# Patient Record
Sex: Female | Born: 1975
Health system: Southern US, Community
[De-identification: ages and names within clinical notes are randomized; demographics above are authoritative.]

## PROBLEM LIST (undated history)

## (undated) DIAGNOSIS — M3500 Sicca syndrome, unspecified: Secondary | ICD-10-CM

## (undated) DIAGNOSIS — F411 Generalized anxiety disorder: Secondary | ICD-10-CM

## (undated) DIAGNOSIS — K219 Gastro-esophageal reflux disease without esophagitis: Secondary | ICD-10-CM

## (undated) DIAGNOSIS — I959 Hypotension, unspecified: Secondary | ICD-10-CM

## (undated) DIAGNOSIS — M6281 Muscle weakness (generalized): Secondary | ICD-10-CM

## (undated) DIAGNOSIS — L259 Unspecified contact dermatitis, unspecified cause: Secondary | ICD-10-CM

## (undated) DIAGNOSIS — Z800 Family history of malignant neoplasm of digestive organs: Secondary | ICD-10-CM

## (undated) DIAGNOSIS — F329 Major depressive disorder, single episode, unspecified: Secondary | ICD-10-CM

## (undated) DIAGNOSIS — M6282 Rhabdomyolysis: Secondary | ICD-10-CM

## (undated) DIAGNOSIS — M2550 Pain in unspecified joint: Secondary | ICD-10-CM

## (undated) DIAGNOSIS — N3010 Interstitial cystitis (chronic) without hematuria: Secondary | ICD-10-CM

## (undated) DIAGNOSIS — IMO0001 Reserved for inherently not codable concepts without codable children: Secondary | ICD-10-CM

## (undated) DIAGNOSIS — N926 Irregular menstruation, unspecified: Secondary | ICD-10-CM

## (undated) DIAGNOSIS — J45909 Unspecified asthma, uncomplicated: Secondary | ICD-10-CM

## (undated) DIAGNOSIS — K759 Inflammatory liver disease, unspecified: Secondary | ICD-10-CM

## (undated) DIAGNOSIS — D126 Benign neoplasm of colon, unspecified: Secondary | ICD-10-CM

## (undated) DIAGNOSIS — G2581 Restless legs syndrome: Secondary | ICD-10-CM

## (undated) DIAGNOSIS — D473 Essential (hemorrhagic) thrombocythemia: Secondary | ICD-10-CM

## (undated) DIAGNOSIS — E079 Disorder of thyroid, unspecified: Secondary | ICD-10-CM

## (undated) DIAGNOSIS — K760 Fatty (change of) liver, not elsewhere classified: Secondary | ICD-10-CM

## (undated) DIAGNOSIS — R339 Retention of urine, unspecified: Secondary | ICD-10-CM

## (undated) DIAGNOSIS — R7982 Elevated C-reactive protein (CRP): Secondary | ICD-10-CM

## (undated) DIAGNOSIS — E663 Overweight: Secondary | ICD-10-CM

## (undated) DIAGNOSIS — Z8679 Personal history of other diseases of the circulatory system: Secondary | ICD-10-CM

## (undated) DIAGNOSIS — E282 Polycystic ovarian syndrome: Secondary | ICD-10-CM

## (undated) DIAGNOSIS — I10 Essential (primary) hypertension: Secondary | ICD-10-CM

## (undated) DIAGNOSIS — G43909 Migraine, unspecified, not intractable, without status migrainosus: Secondary | ICD-10-CM

## (undated) DIAGNOSIS — K589 Irritable bowel syndrome without diarrhea: Secondary | ICD-10-CM

## (undated) DIAGNOSIS — E785 Hyperlipidemia, unspecified: Secondary | ICD-10-CM

## (undated) DIAGNOSIS — F341 Dysthymic disorder: Secondary | ICD-10-CM

## (undated) DIAGNOSIS — Q613 Polycystic kidney, unspecified: Secondary | ICD-10-CM

## (undated) DIAGNOSIS — F3289 Other specified depressive episodes: Secondary | ICD-10-CM

## (undated) DIAGNOSIS — K449 Diaphragmatic hernia without obstruction or gangrene: Secondary | ICD-10-CM

## (undated) DIAGNOSIS — K7689 Other specified diseases of liver: Secondary | ICD-10-CM

## (undated) DIAGNOSIS — R011 Cardiac murmur, unspecified: Secondary | ICD-10-CM

## (undated) HISTORY — DX: Rhabdomyolysis: M62.82

## (undated) HISTORY — DX: Gastro-esophageal reflux disease without esophagitis: K21.9

## (undated) HISTORY — DX: Irregular menstruation, unspecified: N92.6

## (undated) HISTORY — DX: Sjogren syndrome, unspecified: M35.00

## (undated) HISTORY — DX: Benign neoplasm of colon, unspecified: D12.6

## (undated) HISTORY — DX: Family history of malignant neoplasm of digestive organs: Z80.0

## (undated) HISTORY — PX: COLONOSCOPY: SHX174

## (undated) HISTORY — PX: TONSILLECTOMY: SUR1361

## (undated) HISTORY — DX: Muscle weakness (generalized): M62.81

## (undated) HISTORY — DX: Dysthymic disorder: F34.1

## (undated) HISTORY — DX: Retention of urine, unspecified: R33.9

## (undated) HISTORY — DX: Unspecified contact dermatitis, unspecified cause: L25.9

## (undated) HISTORY — DX: Essential (primary) hypertension: I10

## (undated) HISTORY — DX: Irritable bowel syndrome, unspecified: K58.9

## (undated) HISTORY — DX: Hyperlipidemia, unspecified: E78.5

## (undated) HISTORY — DX: Inflammatory liver disease, unspecified: K75.9

## (undated) HISTORY — DX: Restless legs syndrome: G25.81

## (undated) HISTORY — DX: Hypotension, unspecified: I95.9

## (undated) HISTORY — DX: Other specified depressive episodes: F32.89

## (undated) HISTORY — DX: Diaphragmatic hernia without obstruction or gangrene: K44.9

## (undated) HISTORY — DX: Unspecified asthma, uncomplicated: J45.909

## (undated) HISTORY — DX: Polycystic ovarian syndrome: E28.2

## (undated) HISTORY — DX: Reserved for inherently not codable concepts without codable children: IMO0001

## (undated) HISTORY — DX: Fatty (change of) liver, not elsewhere classified: K76.0

## (undated) HISTORY — DX: Overweight: E66.3

## (undated) HISTORY — DX: Cardiac murmur, unspecified: R01.1

## (undated) HISTORY — DX: Interstitial cystitis (chronic) without hematuria: N30.10

## (undated) HISTORY — DX: Pain in unspecified joint: M25.50

## (undated) HISTORY — DX: Other specified diseases of liver: K76.89

## (undated) HISTORY — PX: POLYPECTOMY: SHX149

## (undated) HISTORY — DX: Elevated C-reactive protein (CRP): R79.82

## (undated) HISTORY — DX: Personal history of other diseases of the circulatory system: Z86.79

## (undated) HISTORY — DX: Essential (hemorrhagic) thrombocythemia: D47.3

## (undated) HISTORY — DX: Major depressive disorder, single episode, unspecified: F32.9

## (undated) HISTORY — DX: Migraine, unspecified, not intractable, without status migrainosus: G43.909

## (undated) HISTORY — DX: Polycystic kidney, unspecified: Q61.3

## (undated) HISTORY — DX: Disorder of thyroid, unspecified: E07.9

## (undated) HISTORY — DX: Generalized anxiety disorder: F41.1

## (undated) HISTORY — PX: APPENDECTOMY: SHX54

---

## 1997-07-23 ENCOUNTER — Emergency Department (HOSPITAL_COMMUNITY): Admission: EM | Admit: 1997-07-23 | Discharge: 1997-07-23 | Payer: Self-pay | Admitting: Emergency Medicine

## 1997-12-21 ENCOUNTER — Other Ambulatory Visit: Admission: RE | Admit: 1997-12-21 | Discharge: 1997-12-21 | Payer: Self-pay | Admitting: Obstetrics and Gynecology

## 1998-12-20 ENCOUNTER — Other Ambulatory Visit: Admission: RE | Admit: 1998-12-20 | Discharge: 1998-12-20 | Payer: Self-pay | Admitting: Obstetrics and Gynecology

## 2000-03-19 ENCOUNTER — Other Ambulatory Visit: Admission: RE | Admit: 2000-03-19 | Discharge: 2000-03-19 | Payer: Self-pay | Admitting: Obstetrics and Gynecology

## 2006-06-24 ENCOUNTER — Encounter: Payer: Self-pay | Admitting: Critical Care Medicine

## 2008-01-20 HISTORY — PX: TUBAL LIGATION: SHX77

## 2008-09-28 ENCOUNTER — Encounter: Payer: Self-pay | Admitting: Critical Care Medicine

## 2009-04-26 ENCOUNTER — Ambulatory Visit: Payer: Self-pay | Admitting: Critical Care Medicine

## 2009-04-26 ENCOUNTER — Telehealth (INDEPENDENT_AMBULATORY_CARE_PROVIDER_SITE_OTHER): Payer: Self-pay | Admitting: *Deleted

## 2009-04-26 DIAGNOSIS — F341 Dysthymic disorder: Secondary | ICD-10-CM

## 2009-04-26 DIAGNOSIS — I10 Essential (primary) hypertension: Secondary | ICD-10-CM | POA: Insufficient documentation

## 2009-04-26 DIAGNOSIS — D759 Disease of blood and blood-forming organs, unspecified: Secondary | ICD-10-CM | POA: Insufficient documentation

## 2009-04-26 DIAGNOSIS — E785 Hyperlipidemia, unspecified: Secondary | ICD-10-CM

## 2009-04-26 DIAGNOSIS — J45998 Other asthma: Secondary | ICD-10-CM

## 2009-04-26 DIAGNOSIS — K7689 Other specified diseases of liver: Secondary | ICD-10-CM | POA: Insufficient documentation

## 2009-04-26 DIAGNOSIS — Q613 Polycystic kidney, unspecified: Secondary | ICD-10-CM | POA: Insufficient documentation

## 2009-04-26 DIAGNOSIS — Z8679 Personal history of other diseases of the circulatory system: Secondary | ICD-10-CM | POA: Insufficient documentation

## 2009-04-26 DIAGNOSIS — K219 Gastro-esophageal reflux disease without esophagitis: Secondary | ICD-10-CM

## 2009-06-03 ENCOUNTER — Ambulatory Visit: Payer: Self-pay | Admitting: Internal Medicine

## 2009-06-03 DIAGNOSIS — J302 Other seasonal allergic rhinitis: Secondary | ICD-10-CM

## 2009-06-03 DIAGNOSIS — J3089 Other allergic rhinitis: Secondary | ICD-10-CM

## 2009-06-03 DIAGNOSIS — T7800XA Anaphylactic reaction due to unspecified food, initial encounter: Secondary | ICD-10-CM

## 2009-07-23 ENCOUNTER — Telehealth (INDEPENDENT_AMBULATORY_CARE_PROVIDER_SITE_OTHER): Payer: Self-pay | Admitting: *Deleted

## 2009-08-09 ENCOUNTER — Ambulatory Visit: Payer: Self-pay | Admitting: Critical Care Medicine

## 2009-08-21 ENCOUNTER — Ambulatory Visit: Payer: Self-pay | Admitting: Internal Medicine

## 2009-08-22 ENCOUNTER — Encounter: Payer: Self-pay | Admitting: Internal Medicine

## 2009-08-27 ENCOUNTER — Ambulatory Visit: Payer: Self-pay | Admitting: Internal Medicine

## 2009-09-06 ENCOUNTER — Ambulatory Visit: Payer: Self-pay | Admitting: Internal Medicine

## 2009-10-03 ENCOUNTER — Ambulatory Visit: Payer: Self-pay | Admitting: Internal Medicine

## 2009-10-18 ENCOUNTER — Telehealth: Payer: Self-pay | Admitting: Internal Medicine

## 2009-10-29 ENCOUNTER — Ambulatory Visit: Payer: Self-pay | Admitting: Internal Medicine

## 2009-11-22 ENCOUNTER — Ambulatory Visit: Payer: Self-pay | Admitting: Internal Medicine

## 2010-01-19 HISTORY — PX: OTHER SURGICAL HISTORY: SHX169

## 2010-02-18 NOTE — Miscellaneous (Signed)
Summary: Skin Test/[facility]  Skin Test/[facility]   Imported By: Sherian Rein 09/02/2009 09:19:55  _____________________________________________________________________  External Attachment:    Type:   Image     Comment:   External Document

## 2010-02-18 NOTE — Miscellaneous (Signed)
Summary: Injection Record / [facility]    Injection Record / [facility]    Imported By: Lennie Odor 10/09/2009 11:51:40  _____________________________________________________________________  External Attachment:    Type:   Image     Comment:   External Document

## 2010-02-18 NOTE — Assessment & Plan Note (Signed)
Summary: Pulmonary OV   Copy to:  Dr. Shan Levans Primary Provider/Referring Provider:  Daphane Shepherd, PA  CC:  Asthma Follow up.  Pt states breathing is better since last OV but still haivng the feeling to clear throat.  Denies wheezing, chest tightness, and cough..  History of Present Illness: Pulmonary OV  35yo WF with severe persistent asthma with severe atopy   Jun 03, 2009- 33 yoF seen at kind request of Dr Delford Field for allergy evaluation with hx of asthma.  Dx'd with asthma at age 70,with allergy complaints since her early teens.  sneezing nasalcongestion, cough and wheeze are perennial, but worse in Spring and Fall. Ears and eyes itch.  Asthma was unusually bad in April this year, needing a steroid taper. Triggers include several foods, dust, pollens and strong odors.  Migraines attributed to yogurt. Strawberry nd shellfish caused wheezing.  Aspirin causes wheezing with no hx of nasal polyps.Taking daily Xyzal, which has worked better than other antihstamines. Steroid nasal sprays "bother my asthma- make mucus thick". Singulair blamed for ear pain. She had been on allergy vaccine for 10 years, given by her mother who is our allergy patient and on vaccine herself. allergy vaccine dose was minimized while she was pregnant and she blames this for permitting more sinus infectiuons in recent years. She was getting vaccine through Dr Bea Laura. [place]'s office. That program restricted administration outside of office. She understands basis for that, which I discussed with her in detail as a patient safety issue. She is convinced she is having more rhinitis and asthma now since her vaccine injections stopped in March. Never had significant reactions. Has Epipen but never needed to use it. Has 2 young children, one with special needs. Difficulty leaving home with limited child care, to get weekly allergy shots. House, basement, no mold, no pets, dust control, no smokers.    August 09, 2009 12:25  PM This pt has severe asthma with hx of atopy and prior help with asthma therapy.  Pt now off immunotherapy due to  switching providers to Dr Maple Hudson.  Pt due to have repeat skin testing  8/11.  Pt with PFR about 250 recently. Pt denies any significant sore throat, nasal congestion or excess secretions, fever, chills, sweats, unintended weight loss, pleurtic or exertional chest pain, orthopnea PND, or leg swelling Pt denies any increase in rescue therapy over baseline, denies waking up needing it or having any early am or nocturnal exacerbations of coughing/wheezing/or dyspnea. Pt is not compliant with ICS therapy  Asthma History    Asthma Control Assessment:    Age range: 12+ years    Symptoms: >2 days/week    Nighttime Awakenings: 0-2/month    Interferes w/ normal activity: some limitations    SABA use (not for EIB): >2 days/week    ATAQ questionnaire: 1-2    FEV1: 2.12 liters (today)    FEV1 Pred: 2.76 liters (today)    Exacerbations requiring oral systemic steroids: 0-1/year    Asthma Control Assessment: Not Well Controlled   Preventive Screening-Counseling & Management  Alcohol-Tobacco     Smoking Status: never     Passive Smoke Exposure: no  Current Medications (verified): 1)  Xyzal 5 Mg Tabs (Levocetirizine Dihydrochloride) .... Once Daily 2)  Hydrochlorothiazide 25 Mg Tabs (Hydrochlorothiazide) .... Once Daily 3)  Iron Supplement .... Once Daily 4)  Venlafaxine Hcl 150 Mg Xr24h-Cap (Venlafaxine Hcl) .... Take 1 Tablet By Mouth Once A Day 5)  Topamax 25 Mg Tabs (Topiramate) .... Once Daily  6)  Dexilant 60 Mg Cpdr (Dexlansoprazole) .... Once Daily 7)  Klonopin 1 Mg Tabs (Clonazepam) .... 1/2-1 At Bedtime 8)  Prometrium 200 Mg Caps (Progesterone Micronized) .Marland Kitchen.. 10 Days Out of A Month 9)  Qvar 40 Mcg/act  Aers (Beclomethasone Dipropionate) .... Two Puffs Twice Daily As Needed 10)  Xopenex Hfa 45 Mcg/act Aero (Levalbuterol Tartrate) .... One To Two Puff Every 4 Hours As  Needed 11)  Spironolactone 50 Mg Tabs (Spironolactone) .... Take 1 Tablet By Mouth Once A Day  Allergies (verified): 1)  ! Keflex 2)  ! Asa 3)  ! * Shellfish 4)  Pcn 5)  Ibuprofen 6)  Motrin  Past History:  Past medical, surgical, family and social histories (including risk factors) reviewed, and no changes noted (except as noted below).  Past Medical History: Reviewed history from 06/03/2009 and no changes required. ANXIETY DEPRESSION (ICD-300.4) ACID REFLUX DISEASE (ICD-530.81) FATTY LIVER DISEASE (ICD-571.8) THROMBOCYTOSIS (ICD-289.9) POLYCYSTIC KIDNEY DISEASE (ICD-753.12) HYPERLIPIDEMIA (ICD-272.4) ASTHMA (ICD-493.90) Allergic Rhinitis HEART MURMUR, HX OF (ICD-V12.50) HYPERTENSION (ICD-401.9)  Past Surgical History: Reviewed history from 06/03/2009 and no changes required. C-section x 2 tubal ligation 2010 Appendectomy 1980's Tonsillectomy 1980's  Family History: Reviewed history from 04/26/2009 and no changes required. allergies - mother asthma - mother. sister heart disease - MGF, father MGF - PE PGM - cancer ? type aunt - kidney cancer  Social History: Reviewed history from 06/03/2009 and no changes required. Patient never smoked.  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  Review of Systems  The patient denies shortness of breath with activity, shortness of breath at rest, productive cough, non-productive cough, coughing up blood, chest pain, irregular heartbeats, acid heartburn, indigestion, loss of appetite, weight change, abdominal pain, difficulty swallowing, sore throat, tooth/dental problems, headaches, nasal congestion/difficulty breathing through nose, sneezing, itching, ear ache, anxiety, depression, hand/feet swelling, joint stiffness or pain, rash, change in color of mucus, and fever.    Vital Signs:  Patient profile:   35 year old  female Height:      60 inches Weight:      165 pounds BMI:     32.34 O2 Sat:      97 % on Room air Temp:     98.3 degrees F oral Pulse rate:   115 / minute BP sitting:   122 / 92  (left arm) Cuff size:   regular  Vitals Entered By: Gweneth Dimitri RN (August 09, 2009 12:19 PM)  O2 Flow:  Room air CC: Asthma Follow up.  Pt states breathing is better since last OV but still haivng the feeling to clear throat.  Denies wheezing, chest tightness, cough. Comments Medications reviewed with patient Daytime contact number verified with patient. Gweneth Dimitri RN  August 09, 2009 12:16 PM    Physical Exam  Additional Exam:  Gen: Pleasant, well nourished, in no distress ENT: no lesions, no postnasal drip Neck: No JVD, no TMG, no carotid bruits Lungs: no use of accessory muscles, no dullness to percussion, clear without rales or rhonchi Cardiovascular: RRR, heart sounds normal, no murmurs or gallops, no peripheral edema Musculoskeletal: No deformities, no cyanosis or clubbing     Pre-Spirometry FEV1    Value: 2.12 L     Pred: 2.76 L     Impression & Recommendations:  Problem # 1:  ASTHMA (ICD-493.90) Assessment Unchanged severe persistent asthma with severe atopy plan cont plans to  obtain allergy skin testing and new vaccine per dr young pt needs to be more compliant with ics daily use of as needed SABA explained to the pt  Medications Added to Medication List This Visit: 1)  Venlafaxine Hcl 150 Mg Xr24h-cap (Venlafaxine hcl) .... Take 1 tablet by mouth once a day 2)  Klonopin 1 Mg Tabs (Clonazepam) .... 1/2-1 at bedtime 3)  Spironolactone 50 Mg Tabs (Spironolactone) .... Take 1 tablet by mouth once a day  Complete Medication List: 1)  Xyzal 5 Mg Tabs (Levocetirizine dihydrochloride) .... Once daily 2)  Hydrochlorothiazide 25 Mg Tabs (Hydrochlorothiazide) .... Once daily 3)  Iron Supplement  .... Once daily 4)  Venlafaxine Hcl 150 Mg Xr24h-cap (Venlafaxine hcl) .... Take 1 tablet by  mouth once a day 5)  Topamax 25 Mg Tabs (Topiramate) .... Once daily 6)  Dexilant 60 Mg Cpdr (Dexlansoprazole) .... Once daily 7)  Klonopin 1 Mg Tabs (Clonazepam) .... 1/2-1 at bedtime 8)  Prometrium 200 Mg Caps (Progesterone micronized) .Marland Kitchen.. 10 days out of a month 9)  Qvar 40 Mcg/act Aers (Beclomethasone dipropionate) .... Two puffs twice daily as needed 10)  Xopenex Hfa 45 Mcg/act Aero (Levalbuterol tartrate) .... One to two puff every 4 hours as needed 11)  Spironolactone 50 Mg Tabs (Spironolactone) .... Take 1 tablet by mouth once a day  Other Orders: Est. Patient Level IV (16010)  Patient Instructions: 1)  Restart Qvar two puff twice daily for 7days then two puff daily 2)  Use Xopenex as needed 3)  No other medication changes 4)  Keep allergy skin test appt 5)  Return 4 months Prescriptions: XOPENEX HFA 45 MCG/ACT AERO (LEVALBUTEROL TARTRATE) one to two puff every 4 hours as needed  #1 x 6   Entered and Authorized by:   Storm Frisk MD   Signed by:   Storm Frisk MD on 08/09/2009   Method used:   Print then Give to Patient   RxID:   [id number] QVAR 40 MCG/ACT  AERS (BECLOMETHASONE DIPROPIONATE) Two puffs twice daily as needed  #1 x 6   Entered and Authorized by:   Storm Frisk MD   Signed by:   Storm Frisk MD on 08/09/2009   Method used:   Electronically to        [facility]* (retail)       [address]       [place], [place]  [address]       Ph: [phone]       Fax: [phone]   RxID:   [id number]

## 2010-02-18 NOTE — Miscellaneous (Signed)
Summary: Allergy Inject Program/[facility]  Allergy Inject Program/[facility]   Imported By: Sherian Rein 06/06/2009 09:20:40  _____________________________________________________________________  External Attachment:    Type:   Image     Comment:   External Document

## 2010-02-18 NOTE — Assessment & Plan Note (Signed)
Summary: allergy consult/ap[facility]   Vital Signs:  Patient profile:   35 year old female Height:      60 inches Weight:      171 pounds BMI:     33.52 O2 Sat:      97 % on Room air Pulse rate:   92 / minute BP sitting:   112 / 68  (left arm) Cuff size:   regular  Vitals Entered By: Gweneth Dimitri RN (Jun 03, 2009 3:12 PM)  O2 Flow:  Room air CC: Allergy Consult.   Comments Medications reviewed with patient Daytime contact number verified with patient. Gweneth Dimitri RN  Jun 03, 2009 3:13 PM    Copy to:  Dr. Shan Levans Primary Provider/Referring Provider:  Daphane Shepherd, PA  CC:  Allergy Consult.  Marland Kitchen  History of Present Illness: Jun 03, 2009- 33 yoF seen at kind request of Dr Delford Field for allergy evaluation with hx of asthma.  Dx'd with asthma at age 72,with allergy complaints since her early teens.  sneezing nasalcongestion, cough and wheeze are perennial, but worse in Spring and Fall. Ears and eyes itch.  Asthma was unusually bad in April this year, needing a steroid taper. Triggers include several foods, dust, pollens and strong odors.  Migraines attributed to yogurt. Strawberry nd shellfish caused wheezing.  Aspirin causes wheezing with no hx of nasal polyps.Taking daily Xyzal, which has worked better than other antihstamines. Steroid nasal sprays "bother my asthma- make mucus thick". Singulair blamed for ear pain. She had been on allergy vaccine for 10 years, given by her mother who is our allergy patient and on vaccine herself. allergy vaccine dose was minimized while she was pregnant and she blames this for permitting more sinus infectiuons in recent years. She was getting vaccine through Dr Bea Laura. [place]'s office. That program restricted administration outside of office. She understands basis for that, which I discussed with her in detail as a patient safety issue. She is convinced she is having more rhinitis and asthma now since her vaccine injections stopped in March. Never had  significant reactions. Has Epipen but never needed to use it. Has 2 Kenyada Dosch children, one with special needs. Difficulty leaving home with limited child care, to get weekly allergy shots. House, basement, no mold, no pets, dust control, no smokers.  Current Medications (verified): 1)  Xyzal 5 Mg Tabs (Levocetirizine Dihydrochloride) .... Once Daily 2)  Hydrochlorothiazide 25 Mg Tabs (Hydrochlorothiazide) .... Once Daily 3)  Iron Supplement .... Once Daily 4)  Cymbalta 60 Mg Cpep (Duloxetine Hcl) .... Once Daily 5)  Topamax 25 Mg Tabs (Topiramate) .... Once Daily 6)  Dexilant 60 Mg Cpdr (Dexlansoprazole) .... Once Daily 7)  Klonopin 1 Mg Tabs (Clonazepam) .... At Bedtime 8)  Prometrium 200 Mg Caps (Progesterone Micronized) .Marland Kitchen.. 10 Days Out of A Month 9)  Qvar 40 Mcg/act  Aers (Beclomethasone Dipropionate) .... Two Puffs Twice Daily As Needed 10)  Xopenex Hfa 45 Mcg/act Aero (Levalbuterol Tartrate) .... One To Two Puff Every 4 Hours As Needed  Allergies (verified): 1)  ! Keflex 2)  ! Asa 3)  ! * Shellfish 4)  Pcn 5)  Ibuprofen 6)  Motrin  Past History:  Past Medical History: ANXIETY DEPRESSION (ICD-300.4) ACID REFLUX DISEASE (ICD-530.81) FATTY LIVER DISEASE (ICD-571.8) THROMBOCYTOSIS (ICD-289.9) POLYCYSTIC KIDNEY DISEASE (ICD-753.12) HYPERLIPIDEMIA (ICD-272.4) ASTHMA (ICD-493.90) Allergic Rhinitis HEART MURMUR, HX OF (ICD-V12.50) HYPERTENSION (ICD-401.9)  Past Surgical History: C-section x 2 tubal ligation 2010 Appendectomy 1980's Tonsillectomy 1980's  Social History: Patient never smoked.  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  Review of Systems       The patient complains of shortness of breath with activity, non-productive cough, sneezing, anxiety, and hand/feet swelling.  The patient denies shortness of breath at rest, productive cough, coughing up blood,  chest pain, irregular heartbeats, acid heartburn, indigestion, loss of appetite, weight change, abdominal pain, difficulty swallowing, sore throat, tooth/dental problems, headaches, nasal congestion/difficulty breathing through nose, itching, ear ache, depression, joint stiffness or pain, rash, change in color of mucus, and fever.    Physical Exam  Additional Exam:  General: A/Ox3; pleasant and cooperative, NAD, guarded affect SKIN: no rash, lesions NODES: no lymphadenopathy HEENT: [place]/AT, EOM- WNL, Conjuctivae- clear, PERRLA, TM-WNL, Nose- clear, turbinates edematous, not pale, light mucus bridging, Throat- clear and wnl NECK: Supple w/ fair ROM, JVD- none, normal carotid impulses w/o bruits Thyroid- normal to palpation CHEST: Clear to P&A HEART: RRR, no m/g/r heard ABDOMEN: Soft and nl; nml bowel sounds;  EAV:WUJW, nl pulses, no edema  NEURO: Grossly intact to observation     Impression & Recommendations:  Problem # 1:  ALLERGIC RHINITIS (ICD-477.9) She has been on allergy vaccine 10 years, off about 2 months. I asked about her conviction that she needs to get back on allergy vaccine and we discussed anaphyllaxis, epinephrine, policy and logistics. We need to assess the actual weight of atopic disease now. We considered options. She understands preference is to give allergy vaccine in a medical office when possible. She has epipen. We reviewed patient information form. Her mother has given her allergy shots for years and could continue to do so, with the key point that a responsible and capable adult would be present each time. Her updated medication list for this problem includes:    Xyzal 5 Mg Tabs (Levocetirizine dihydrochloride) ..... Once daily  Problem # 2:  ASTHMA (ICD-493.90) This is being managed by Dr Delford Field. His note was reviewed.  Problem # 3:  ALLERGY, FOOD (ICD-693.1) She associates wheezing and/ or migraine with specific foods and understands avoidance, use of Epipen and  rapid emergency care access.  Medications Added to Medication List This Visit: 1)  Hydrochlorothiazide 25 Mg Tabs (Hydrochlorothiazide) .... Once daily 2)  Dexilant 60 Mg Cpdr (Dexlansoprazole) .... Once daily 3)  Qvar 40 Mcg/act Aers (Beclomethasone dipropionate) .... Two puffs twice daily as needed  Other Orders: Consultation Level IV (11914)  Patient Instructions: 1)  Schedule return for allergy skin testing.- Stop all antihistamines 3 days before skin testing, including cold and allergy meds, otc sleep and cough meds. This includes your Xyzal

## 2010-02-18 NOTE — Letter (Signed)
Summary: Date Range:12-21-96 to 06-24-06 (skin tests)/[facility]  Date Range:12-21-96 to 06-24-06/[facility] (Skin Test)   Imported By: Sherian Rein 07/08/2009 13:52:06  _____________________________________________________________________  External Attachment:    Type:   Image     Comment:   External Document

## 2010-02-18 NOTE — Letter (Signed)
Summary: [facility]  [facility]   Imported By: Sherian Rein 07/08/2009 13:53:33  _____________________________________________________________________  External Attachment:    Type:   Image     Comment:   External Document

## 2010-02-18 NOTE — Progress Notes (Signed)
Summary: Asthma flare---Prednisone RX  Phone Note Call from Patient   Caller: Patient Call For: wright Reason for Call: Talk to Nurse Summary of Call: Problems with her asthma x 2 wks, wants prednisone called in, pls advise.//[facility]([phone]) Initial call taken by: Darletta Moll,  July 23, 2009 10:19 AM  Follow-up for Phone Call        The patient c/o increased sob at rest and with exertion, chest tightness, cough with occasional white/yellow mucus and wheezing x2 weeks. She is using her Qvar 2 pfs two times a day and says she has had to use her rescue inhaler once or twice daily since this started. She is requesting a Prednisone taper. Patient is due for follow-up. Please advise. Allergies (verified):  1)  ! Keflex 2)  ! Asa 3)  ! * Shellfish 4)  Pcn 5)  Ibuprofen 6)  Motrin   Follow-up by: Michel Bickers CMA,  July 23, 2009 10:35 AM  Additional Follow-up for Phone Call Additional follow up Details #1::        Pt really needs an OV to sort out. Ok to call in prednisone 10mg  4 each am x3days, 3 x 3days, 2 x 3days, 1 x 3days then stop, until she can get in Additional Follow-up by: Storm Frisk MD,  July 23, 2009 10:50 AM    Additional Follow-up for Phone Call Additional follow up Details #2::    Patient is aware of Prednisone taper and will call for sooner appt if her sxs do not improve or get worse. She is sch for follow-up with PW on 08/09/2009 @ 12 noon. Follow-up by: Michel Bickers CMA,  July 23, 2009 11:09 AM  New/Updated Medications: PREDNISONE 10 MG TABS (PREDNISONE) 4 by mouth x3 days, 3 by mouth x3 days, 2 by mouth x3 days, 1 by mouth x3 days and stop. Prescriptions: PREDNISONE 10 MG TABS (PREDNISONE) 4 by mouth x3 days, 3 by mouth x3 days, 2 by mouth x3 days, 1 by mouth x3 days and stop.  #30 x 0   Entered by:   Michel Bickers CMA   Authorized by:   Storm Frisk MD   Signed by:   Michel Bickers CMA on 07/23/2009   Method used:   Electronically to   [facility] (retail)       [address]       [place], [place]  [address]       Ph: [phone]       Fax: [phone]   RxID:   [id number]

## 2010-02-18 NOTE — Progress Notes (Signed)
Summary: allergies  Phone Note Call from Patient Call back at Home Phone [phone]   Caller: Patient Call For: young Reason for Call: Talk to Nurse Summary of Call: Patient calling with questions about her allergies/allergy test.  Initial call taken by: Lehman Prom,  October 18, 2009 12:53 PM  Follow-up for Phone Call         Pt was iIn hospital with  polymyostitis, hepatitis, and enlarged liver. Pt is wondering if this could be related but her wheat allergy ot gluten allergy? He symptoms wer pain, muscle weakness, fatigue, andoverall weakness. Pt ants to know what Dr. Maple Hudson thinks, could this be related to allergies or pt MS? Please advise.Carron Curie CMA  October 18, 2009 2:22 PM   Additional Follow-up for Phone Call Additional follow up Details #1::        i don't know what her lab results in hospital were. This would be an unsusually severe reaction regardless. Did her doctors consider possible reaction to Lisinopril? Tests can be sent for gluten problems.  Additional Follow-up by: Waymon Budge MD,  October 18, 2009 5:21 PM    Additional Follow-up for Phone Call Additional follow up Details #2::    Pt advised of CY recs and she states she will try a gluten free diet to see if that makes a difference.  Pt also requests a refill on xyzal which I have sent in. Carron Curie [facility]  October 18, 2009 5:28 PM   Prescriptions: XYZAL 5 MG TABS (LEVOCETIRIZINE DIHYDROCHLORIDE) once daily  #30 x 5   Entered by:   Carron Curie CMA   Authorized by:   Waymon Budge MD   Signed by:   Carron Curie CMA on 10/18/2009   Method used:   Electronically to        [facility] (retail)       [address]       [place], [place]  [address]       Ph: [phone]       Fax: [phone]   RxID:   [id number]

## 2010-02-18 NOTE — Miscellaneous (Signed)
Summary: Injection Record / [facility]    Injection Record / [facility]    Imported By: Lennie Odor 11/26/2009 15:06:49  _____________________________________________________________________  External Attachment:    Type:   Image     Comment:   External Document

## 2010-02-18 NOTE — Assessment & Plan Note (Signed)
Summary: ALLERGY TESTING ///kp   Vital Signs:  Patient profile:   35 year old female Height:      60 inches Weight:      163.13 pounds BMI:     31.97 O2 Sat:      98 % on Room air Pulse rate:   115 / minute BP sitting:   130 / 72  (left arm) Cuff size:   regular  Vitals Entered By: Reynaldo Minium CMA (August 21, 2009 3:12 PM)  O2 Flow:  Room air   Copy to:  Dr. Shan Levans Primary Provider/Referring Provider:  Daphane Shepherd, PA   History of Present Illness: Jun 03, 2009- 33 yoF seen at kind request of Dr Delford Field for allergy evaluation with hx of asthma.  Dx'd with asthma at age 26,with allergy complaints since her early teens.   August 21, 2009- Allergic asthma Doing well today, coming for allergy skin testing off antihistamines with no flare ups recently.  She has recently been put on lisiniopril and we discussed association with dry cough, hives. So far no problems recognized. Since off allergy shots she says she has had to use her rescue inhaler almost daily and has had more sinus infections. We discussed her difficulties with child care, mother lives near there, is a nurse experienced with allergy vaccine. skin test- grass, weed, dust    Preventive Screening-Counseling & Management  Alcohol-Tobacco     Smoking Status: never     Passive Smoke Exposure: no  Current Medications (verified): 1)  Xyzal 5 Mg Tabs (Levocetirizine Dihydrochloride) .... Once Daily 2)  Lisinopril-Hydrochlorothiazide 20-25 Mg Tabs (Lisinopril-Hydrochlorothiazide) .... Take 1 By Mouth Once Daily 3)  Iron Supplement .... Once Daily 4)  Venlafaxine Hcl 150 Mg Xr24h-Cap (Venlafaxine Hcl) .... Take 1 Tablet By Mouth Once A Day 5)  Topamax 25 Mg Tabs (Topiramate) .... Once Daily 6)  Dexilant 60 Mg Cpdr (Dexlansoprazole) .... Once Daily 7)  Klonopin 1 Mg Tabs (Clonazepam) .... 1/2-1 At Bedtime 8)  Prometrium 200 Mg Caps (Progesterone Micronized) .Marland Kitchen.. 10 Days Out of A Month 9)  Qvar 40 Mcg/act  Aers  (Beclomethasone Dipropionate) .... Two Puffs Twice Daily As Needed 10)  Xopenex Hfa 45 Mcg/act Aero (Levalbuterol Tartrate) .... One To Two Puff Every 4 Hours As Needed 11)  Spironolactone 50 Mg Tabs (Spironolactone) .... Take 1 Tablet By Mouth Once A Day  Allergies (verified): 1)  ! Keflex 2)  ! Asa 3)  ! * Shellfish 4)  Pcn 5)  Ibuprofen 6)  Motrin  Past History:  Past Medical History: Last updated: 06/03/2009 ANXIETY DEPRESSION (ICD-300.4) ACID REFLUX DISEASE (ICD-530.81) FATTY LIVER DISEASE (ICD-571.8) THROMBOCYTOSIS (ICD-289.9) POLYCYSTIC KIDNEY DISEASE (ICD-753.12) HYPERLIPIDEMIA (ICD-272.4) ASTHMA (ICD-493.90) Allergic Rhinitis HEART MURMUR, HX OF (ICD-V12.50) HYPERTENSION (ICD-401.9)  Past Surgical History: Last updated: 06/03/2009 C-section x 2 tubal ligation 2010 Appendectomy 1980's Tonsillectomy 1980's  Family History: Last updated: 04/26/2009 allergies - mother asthma - mother. sister heart disease - MGF, father MGF - PE PGM - cancer ? type aunt - kidney cancer  Social History: Last updated: 06/03/2009 Patient never smoked.  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  Risk Factors: Smoking Status: never (08/21/2009) Passive Smoke Exposure: no (08/21/2009)  Review of Systems      See HPI       The patient complains of shortness of breath with activity, nasal congestion/difficulty breathing through nose,  and sneezing.  The patient denies shortness of breath at rest, productive cough, non-productive cough, coughing up blood, chest pain, irregular heartbeats, acid heartburn, indigestion, loss of appetite, tooth/dental problems, and headaches.    Physical Exam  Additional Exam:  Additional Exam:  General: A/Ox3; pleasant and cooperative, NAD,  SKIN: no rash, lesions NODES: no lymphadenopathy HEENT: [place]/AT, EOM- WNL, Conjuctivae- clear, PERRLA,  TM-WNL, Nose- clear, light mucus bridging, Throat- clear and wnl NECK: Supple w/ fair ROM, JVD- none, normal carotid impulses w/o bruits Thyroid-  CHEST: Clear to P&A HEART: RRR, no m/g/r heard ABDOMEN: Overweight;  WJX:BJYN, nl pulses, no edema  NEURO: Grossly intact to observation     Impression & Recommendations:  Problem # 1:  ALLERGY, FOOD (ICD-693.1)  Positive skin test for wheat of uncertain clinical significance. She can try keeping a food diary. I am not recommending a restrictive diet at this time. We discussed again food allergy vs intolerance.  Orders: Est. Patient Level II (82956)  Problem # 2:  ASTHMA (ICD-493.90) She gives a strong story that she believes allergy vaccine had suppressed rhinosinusitis and Asthma. Her mother is a Engineer, civil (consulting), expreienced with allergy shots. Because of difficulty with child care logistics, we decided after frank risk/ benefit discussion, to let them give her vaccine after training.  Medications Added to Medication List This Visit: 1)  Lisinopril-hydrochlorothiazide 20-25 Mg Tabs (Lisinopril-hydrochlorothiazide) .... Take 1 by mouth once daily 2)  Epipen 0.3 Mg/0.64ml Devi (Epinephrine) .... For severe allergic reaction 3)  Allergy Vaccine Go New Start  .... Mother c. ivy, rn to give  Other Orders: Allergy Puncture Test (21308) Allergy I.D Test (65784)  Patient Instructions: 1)  Please schedule a follow-up appointment in 2 months. 2)  I will have the allergy lab call you when vaccine is ready to start. 3)  Script for epipen to keep with your allergy vaccine supplies as first aid in case there is ever a severe allergic reaction to an allergy shot. Prescriptions: EPIPEN 0.3 MG/0.3ML DEVI (EPINEPHRINE) For severe allergic reaction  #1 x prn   Entered and Authorized by:   Waymon Budge MD   Signed by:   Waymon Budge MD on 08/21/2009   Method used:   Print then Give to Patient   RxID:   [id number]

## 2010-02-18 NOTE — Progress Notes (Signed)
Summary: CXR result  Phone Note Outgoing Call   Reason for Call: Discuss lab or test results Summary of Call: Call the patient and tell her CXR was normal  Initial call taken by: Storm Frisk MD,  April 26, 2009 5:07 PM  Follow-up for Phone Call        Called pt's home number.  [facility] Crystal Jones RN  April 26, 2009 5:11 PM   Additional Follow-up for Phone Call Additional follow up Details #1::        Patient notified of x-ray results. Additional Follow-up by: Leonette Monarch,  April 29, 2009 2:51 PM

## 2010-02-18 NOTE — Miscellaneous (Signed)
Summary: New Vaccine/[facility]  New Vaccine/[facility]   Imported By: Sherian Rein 09/19/2009 08:36:12  _____________________________________________________________________  External Attachment:    Type:   Image     Comment:   External Document

## 2010-02-18 NOTE — Assessment & Plan Note (Signed)
Summary: Pulmonary Consultation   Primary Provider/Referring Provider:  Daphane Shepherd, PA  CC:  Pulmonary Consult for asthma and allergies..  History of Present Illness: Pulmonary Consultation       This is a 35 year old female who presents with asthma.  The patient complains of history of diagnosed Asthma, cough, shortness of breath, mucous production, nocturnal awakening, exercise induced symptoms, and congestion, but denies chest tightness, chest pain, and wheezing.  Symptoms appear triggered by stress, sinusitis, and allergen:.  The patient also has the following associated problems: heartburn, sour taste in mouth, allergic rhinitis, indigestion, abdominal pain, hoarseness, sore throat, sneezing, itching, nasal congestion, difficulty breathing through nose, and non-productive cough.  Previous effective treatment includes short acting beta-agonist: and oral steroids.  Asthma monitoring and treatment delivery to date has included: no home nebulizer and no PF meter.    This pt was dx asthma since 35yo  and now has  recurrent symptoms of  dyspnea,  hoarseness,  cough,  stopped up nose,  some pndrip.  Pt only on xopenex hfa as needed   The pt was on allergy desensitization,  wants shots given here. Pt currently on bactrim for bronchitis  Asthma History    Initial Asthma Severity Rating:    Age range: 12+ years    Symptoms: daily    Nighttime Awakenings: 3-4/month    Interferes w/ normal activity: no limitations    SABA use (not for EIB): >2 days/week but not >1X/day    Exacerbations requiring oral systemic steroids: 0-1/year    Asthma Severity Assessment: Moderate Persistent    Preventive Screening-Counseling & Management  Alcohol-Tobacco     Smoking Status: never     Passive Smoke Exposure: no  Current Medications (verified): 1)  Xyzal 5 Mg Tabs (Levocetirizine Dihydrochloride) .... Once Daily 2)  Triamterene-Hctz 37.5-25 Mg Tabs (Triamterene-Hctz) .... Once Daily 3)  Iron  Supplement .... Once Daily 4)  Cymbalta 60 Mg Cpep (Duloxetine Hcl) .... Once Daily 5)  Topamax 25 Mg Tabs (Topiramate) .... Once Daily 6)  Pantoprazole Sodium 40 Mg Tbec (Pantoprazole Sodium) .... Once Daily 7)  Klonopin 1 Mg Tabs (Clonazepam) .... At Bedtime 8)  Prometrium 200 Mg Caps (Progesterone Micronized) .Marland Kitchen.. 10 Days Out of A Month  Allergies (verified): 1)  ! Keflex 2)  ! Asa 3)  ! * Shellfish 4)  Pcn 5)  Ibuprofen 6)  Motrin  Past History:  Past medical, surgical, family and social histories (including risk factors) reviewed, and no changes noted (except as noted below).  Past Medical History: ANXIETY DEPRESSION (ICD-300.4) ACID REFLUX DISEASE (ICD-530.81) FATTY LIVER DISEASE (ICD-571.8) THROMBOCYTOSIS (ICD-289.9) POLYCYSTIC KIDNEY DISEASE (ICD-753.12) HYPERLIPIDEMIA (ICD-272.4) ASTHMA (ICD-493.90) HEART MURMUR, HX OF (ICD-V12.50) HYPERTENSION (ICD-401.9)  Past Surgical History: tubal ligation 2010 Appendectomy 1980's Tonsillectomy 1980's  Family History: Reviewed history and no changes required. allergies - mother asthma - mother. sister heart disease - MGF, father MGF - PE PGM - cancer ? type aunt - kidney cancer  Social History: Reviewed history and no changes required. Patient never smoked.  no alcohol  married  2 boys Works as Museum/gallery exhibitions officer Smoking Status:  never Passive Smoke Exposure:  no  Review of Systems       The patient complains of shortness of breath with activity, shortness of breath at rest, productive cough, non-productive cough, acid heartburn, indigestion, sore throat, headaches, nasal congestion/difficulty breathing through nose, sneezing, itching, ear ache, anxiety, and hand/feet swelling.  The patient denies coughing up blood, chest pain, irregular heartbeats,  loss of appetite, weight change, abdominal pain, difficulty swallowing, tooth/dental problems, joint stiffness or pain, rash, change in color of mucus, and  fever.        See HPI for Pulmonary, General, ENT, and Cardiac review of systems.  Vital Signs:  Patient profile:   35 year old female Height:      60 inches Weight:      174.13 pounds BMI:     34.13 O2 Sat:      95 % on Room air Temp:     98.3 degrees F oral Pulse rate:   107 / minute BP sitting:   122 / 80  (right arm) Cuff size:   regular  Vitals Entered By: Gweneth Dimitri RN (April 26, 2009 2:18 PM)  O2 Flow:  Room air CC: Pulmonary Consult for asthma and allergies. Comments Medications reviewed with patient Daytime contact number verified with patient. Gweneth Dimitri RN  April 26, 2009 2:19 PM     Physical Exam  Additional Exam:  Gen: Pleasant, well-nourished, in no distress,  normal affect ENT: No lesions,  mouth clear,  oropharynx clear, no postnasal drip Neck: No JVD, no TMG, no carotid bruits Lungs: No use of accessory muscles, no dullness to percussion, clear without rales or rhonchi Cardiovascular: RRR, heart sounds normal, no murmur or gallops, no peripheral edema Abdomen: soft and NT, no HSM,  BS normal Musculoskeletal: No deformities, no cyanosis or clubbing Neuro: alert, non focal Skin: Warm, no lesions or rashes    CXR  Procedure date:  04/26/2009  Findings:      IMPRESSION: No active cardiopulmonary disease.  Pulmonary Function Test Date: 04/26/2009 2:59 PM Gender: Female  Pre-Spirometry FVC    Value: 2.82 L/min   % Pred: 86.30 % FEV1    Value: 2.12 L     Pred: 2.76 L     % Pred: 76.90 % FEV1/FVC  Value: 75.21 %     % Pred: 89.80 %  Impression & Recommendations:  Problem # 1:  ASTHMA (ICD-493.90) Assessment Unchanged Moderate persistent asthma with GERD , atopy and recurrent sinusitis as ppt factors, CXR done today was  normal,  spirometry today was normal plan Start Qvar two puffs twice daily Xopenex as needed Finish Bactrim Follow a reflux diet Use Peak Flow Meter twice daily Return two months   Medications Added to Medication  List This Visit: 1)  Xyzal 5 Mg Tabs (Levocetirizine dihydrochloride) .... Once daily 2)  Triamterene-hctz 37.5-25 Mg Tabs (Triamterene-hctz) .... Once daily 3)  Iron Supplement  .... Once daily 4)  Cymbalta 60 Mg Cpep (Duloxetine hcl) .... Once daily 5)  Topamax 25 Mg Tabs (Topiramate) .... Once daily 6)  Pantoprazole Sodium 40 Mg Tbec (Pantoprazole sodium) .... Once daily 7)  Klonopin 1 Mg Tabs (Clonazepam) .... At bedtime 8)  Prometrium 200 Mg Caps (Progesterone micronized) .Marland Kitchen.. 10 days out of a month 9)  Qvar 40 Mcg/act Aers (Beclomethasone dipropionate) .... Two puffs twice daily 10)  Bactrim Ds 800-160 Mg Tabs (Sulfamethoxazole-trimethoprim) .... One by mouth two times a day 11)  Xopenex Hfa 45 Mcg/act Aero (Levalbuterol tartrate) .... One to two puff every 4 hours as needed  Complete Medication List: 1)  Xyzal 5 Mg Tabs (Levocetirizine dihydrochloride) .... Once daily 2)  Triamterene-hctz 37.5-25 Mg Tabs (Triamterene-hctz) .... Once daily 3)  Iron Supplement  .... Once daily 4)  Cymbalta 60 Mg Cpep (Duloxetine hcl) .... Once daily 5)  Topamax 25 Mg Tabs (Topiramate) .... Once daily  6)  Pantoprazole Sodium 40 Mg Tbec (Pantoprazole sodium) .... Once daily 7)  Klonopin 1 Mg Tabs (Clonazepam) .... At bedtime 8)  Prometrium 200 Mg Caps (Progesterone micronized) .Marland Kitchen.. 10 days out of a month 9)  Qvar 40 Mcg/act Aers (Beclomethasone dipropionate) .... Two puffs twice daily 10)  Bactrim Ds 800-160 Mg Tabs (Sulfamethoxazole-trimethoprim) .... One by mouth two times a day 11)  Xopenex Hfa 45 Mcg/act Aero (Levalbuterol tartrate) .... One to two puff every 4 hours as needed  Other Orders: New Patient Level V (04540) Spirometry w/Graph (94010) Allergy Referral  (Allergy) T-2 View CXR (71020TC)  Asthma Management Plan    Asthma Severity: Moderate Persistent    Personal best PEF: 250 liters/minute    Predicted PEF: 350 liters/minute    Working PEF: 300 liters/minute    Plan based on PEF  formula: [place] [employer] Zone: (Range: 350 to 250) QVAR 40 MCG/ACT  AERS:  2 puffs every 12 hours XOPENEX HFA 45 MCG/ACT AERO:  2 puffs every 4 hours as needed  Yellow Zone: XOPENEX HFA 45 MCG/ACT AERO:  2 puffs every 3-4 hours (yellow zone) QVAR 40 MCG/ACT  AERS:  4 puffs every 12 hours  Red Zone: QVAR 40 MCG/ACT  AERS Call your physician for shortness of breath.   XOPENEX HFA 45 MCG/ACT AERO:  2 puffs every 20 minutes x 3 doses as needed for acute flare Start Prednisone Taper.  (we would call this in)  Patient Instructions: 1)  Start Qvar two puffs twice daily 2)  Xopenex as needed 3)  Finish Bactrim 4)  Spirometry and Chest xray will be done today 5)  Follow a reflux diet 6)  Use Peak Flow Meter twice daily 7)  Return two months  Prescriptions: QVAR 40 MCG/ACT  AERS (BECLOMETHASONE DIPROPIONATE) Two puffs twice daily  #1 x 6   Entered and Authorized by:   Storm Frisk MD   Signed by:   Storm Frisk MD on 04/26/2009   Method used:   Electronically to        [facility] (retail)       [address]       [place], [place]  [address]       Ph: [phone]       Fax: [phone]   RxID:   [id number]    Immunization History:  Influenza Immunization History:    Influenza:  historical (11/19/2008)    CardioPerfect Spirometry  ID: [id number] Patient: Diane Ortiz, Diane Ortiz DOB: 05/27/1975 Age: 35 Years Old Sex: Female Race: White Height: 60 Weight: 174.13 Status: Confirmed Recorded: 04/26/2009 2:59 PM  Parameter  Measured Predicted %Predicted FVC     2.82        3.27        86.30 FEV1     2.12        2.76        76.90 FEV1%   75.21        83.80        89.80 PEF    4.44        6.39        69.40   Comments: Mild obstruction in large airways, moderate obstruction in small airways   Interpretation: Pre: FVC= 2.82L FEV1= 2.12L FEV1%= 75.2% 2.12/2.82 FEV1/FVC (04/26/2009 3:00:12 PM), Within normal limits

## 2010-05-07 ENCOUNTER — Other Ambulatory Visit: Payer: Self-pay | Admitting: *Deleted

## 2010-05-07 MED ORDER — LEVOCETIRIZINE DIHYDROCHLORIDE 5 MG PO TABS: 5.0000 mg | ORAL_TABLET | Freq: Every evening | ORAL | Status: DC

## 2010-05-08 ENCOUNTER — Emergency Department (HOSPITAL_COMMUNITY)
Admission: EM | Admit: 2010-05-08 | Discharge: 2010-05-08 | Disposition: A | Discharge status: Home / Self Care | Payer: BC Managed Care – PPO | Attending: Emergency Medicine | Admitting: Emergency Medicine

## 2010-05-08 DIAGNOSIS — F329 Major depressive disorder, single episode, unspecified: Secondary | ICD-10-CM | POA: Insufficient documentation

## 2010-05-08 DIAGNOSIS — J45909 Unspecified asthma, uncomplicated: Secondary | ICD-10-CM | POA: Insufficient documentation

## 2010-05-08 DIAGNOSIS — R45851 Suicidal ideations: Secondary | ICD-10-CM | POA: Insufficient documentation

## 2010-05-08 DIAGNOSIS — R011 Cardiac murmur, unspecified: Secondary | ICD-10-CM | POA: Insufficient documentation

## 2010-05-08 DIAGNOSIS — F3289 Other specified depressive episodes: Secondary | ICD-10-CM | POA: Insufficient documentation

## 2010-05-08 DIAGNOSIS — I10 Essential (primary) hypertension: Secondary | ICD-10-CM | POA: Insufficient documentation

## 2010-05-08 DIAGNOSIS — K219 Gastro-esophageal reflux disease without esophagitis: Secondary | ICD-10-CM | POA: Insufficient documentation

## 2010-05-08 LAB — BASIC METABOLIC PANEL
BUN: 12 mg/dL (ref 6–23)
CO2: 26 mEq/L (ref 19–32)
Chloride: 98 mEq/L (ref 96–112)
Glucose, Bld: 98 mg/dL (ref 70–99)
Potassium: 3.5 mEq/L (ref 3.5–5.1)
Sodium: 132 mEq/L — ABNORMAL LOW (ref 135–145)

## 2010-05-08 LAB — URINALYSIS, ROUTINE W REFLEX MICROSCOPIC
Glucose, UA: NEGATIVE mg/dL
Hgb urine dipstick: NEGATIVE
Ketones, ur: NEGATIVE mg/dL
Protein, ur: NEGATIVE mg/dL
pH: 6.5 (ref 5.0–8.0)

## 2010-05-08 LAB — HEPATIC FUNCTION PANEL
ALT: 23 U/L (ref 0–35)
AST: 26 U/L (ref 0–37)
Alkaline Phosphatase: 126 U/L — ABNORMAL HIGH (ref 39–117)
Bilirubin, Direct: <0.1 mg/dL (ref 0.0–0.3)

## 2010-05-08 LAB — CBC
HCT: 43.9 % (ref 36.0–46.0)
MCV: 87.5 fL (ref 78.0–100.0)
Platelets: 546 10*3/uL — ABNORMAL HIGH (ref 150–400)
RBC: 5.02 MIL/uL (ref 3.87–5.11)
RDW: 13.9 % (ref 11.5–15.5)

## 2010-05-08 LAB — DIFFERENTIAL
Basophils Absolute: 0 10*3/uL (ref 0.0–0.1)
Basophils Relative: 0 % (ref 0–1)
Monocytes Absolute: 1.4 10*3/uL — ABNORMAL HIGH (ref 0.1–1.0)

## 2010-05-08 LAB — RAPID URINE DRUG SCREEN, HOSP PERFORMED
Amphetamines: NOT DETECTED
Barbiturates: NOT DETECTED
Barbiturates: NOT DETECTED
Benzodiazepines: POSITIVE — AB
Cocaine: NOT DETECTED
Opiates: NOT DETECTED

## 2010-09-02 ENCOUNTER — Ambulatory Visit (INDEPENDENT_AMBULATORY_CARE_PROVIDER_SITE_OTHER): Payer: BC Managed Care – PPO

## 2010-09-02 DIAGNOSIS — J309 Allergic rhinitis, unspecified: Secondary | ICD-10-CM

## 2010-10-08 ENCOUNTER — Ambulatory Visit: Payer: BC Managed Care – PPO | Admitting: Internal Medicine

## 2010-11-13 ENCOUNTER — Ambulatory Visit (INDEPENDENT_AMBULATORY_CARE_PROVIDER_SITE_OTHER): Payer: BC Managed Care – PPO | Admitting: Internal Medicine

## 2010-11-13 ENCOUNTER — Encounter: Payer: Self-pay | Admitting: Internal Medicine

## 2010-11-13 VITALS — BP 104/58 | HR 102 | Ht 60.0 in | Wt 166.6 lb

## 2010-11-13 DIAGNOSIS — J309 Allergic rhinitis, unspecified: Secondary | ICD-10-CM

## 2010-11-13 DIAGNOSIS — L272 Dermatitis due to ingested food: Secondary | ICD-10-CM

## 2010-11-13 MED ORDER — LEVOCETIRIZINE DIHYDROCHLORIDE 5 MG PO TABS: 5.0000 mg | ORAL_TABLET | Freq: Every evening | ORAL | Status: DC

## 2010-11-13 NOTE — Patient Instructions (Addendum)
Script sent for Xyzal  Continue allergy vaccine

## 2010-11-13 NOTE — Progress Notes (Signed)
11/13/10- 35 year old female never smoker, history chronic asthma, allergic rhinitis, food allergy complicated by thrombocytosis, polycystic kidney disease, GERD Last here 06/03/2009 Has had flu vaccine. Continues allergy vaccine. History of symptoms flare when she was taken off of allergy vaccine at the [facility] office, before restarting here. She is having little drainage now. Currently on Levaquin for pyelonephritis. Being followed elsewhere for abnormal liver function and white blood count over the past year. Taken off of blood pressure medicines and her pressure stays down. Mentions being tired in the daytime. Feels that she is sleeping well at night and denies snoring. Admits this might be part of her depression. I recommended she discuss it with her psychiatrist.  ROS-see HPI Constitutional:   No-   weight loss, night sweats, fevers, chills, +fatigue, lassitude. HEENT:   No-  headaches, difficulty swallowing, tooth/dental problems, sore throat,       No-  sneezing, itching, ear ache,  Little-nasal congestion, post nasal drip,  CV:  No-   chest pain, orthopnea, PND, swelling in lower extremities, anasarca, dizziness, palpitations Resp: No-   shortness of breath with exertion or at rest.              No-   productive cough,  No non-productive cough,  No- coughing up of blood.              No-   change in color of mucus.  No- wheezing.   Skin: No-   rash or lesions. GI:  No-   heartburn, indigestion, abdominal pain, nausea, vomiting, diarrhea,                 change in bowel habits, loss of appetite GU: No-   dysuria, change in color of urine, no urgency or frequency.  No- flank pain. MS:  No-   joint pain or swelling.  No- decreased range of motion.  No- back pain. Neuro-     nothing unusual Psych:  No- change in mood or affect. No depression or anxiety.  No memory loss.  OBJ General- Alert, Oriented, Affect-appropriate, Distress- none acute Skin- rash-none, lesions-  none, excoriation- none Lymphadenopathy- none Head- atraumatic            Eyes- Gross vision intact, PERRLA, conjunctivae clear secretions            Ears- Hearing, canals-normal            Nose- Clear, no-Septal dev, mucus, polyps, erosion, perforation             Throat- Mallampati II , mucosa clear , drainage- none, tonsils- atrophic Neck- flexible , trachea midline, no stridor , thyroid nl, carotid no bruit Chest - symmetrical excursion , unlabored           Heart/CV- RRR , no murmur , no gallop  , no rub, nl s1 s2                           - JVD- none , edema- none, stasis changes- none, varices- none           Lung- clear to P&A, wheeze- none, cough- none , dullness-none, rub- none           Chest wall-  Abd- tender-no, distended-no, bowel sounds-present, HSM- no Br/ Gen/ Rectal- Not done, not indicated Extrem- cyanosis- none, clubbing, none, atrophy- none, strength- nl Neuro- grossly intact to observation

## 2010-11-16 NOTE — Assessment & Plan Note (Signed)
She avoids foods that cause her problems.

## 2010-11-16 NOTE — Assessment & Plan Note (Signed)
She has been symptomatically well controlled. I agreed to refill her antihistamine. There is no need to go higher on her allergy vaccine concentration for now. She will ask her psychiatrist about her tiredness rather than having that managed here as a separate problem.

## 2011-04-23 ENCOUNTER — Encounter: Payer: Self-pay | Admitting: Gastroenterology

## 2011-05-15 ENCOUNTER — Telehealth: Payer: Self-pay | Admitting: Gastroenterology

## 2011-05-15 NOTE — Telephone Encounter (Signed)
Received copies from [facility] 05/15/11 . Forwarded 19  pages to Dr. Jarold Motto ,for review.

## 2011-05-18 ENCOUNTER — Encounter: Payer: Self-pay | Admitting: *Deleted

## 2011-05-19 ENCOUNTER — Ambulatory Visit: Payer: BC Managed Care – PPO | Admitting: Gastroenterology

## 2011-05-21 ENCOUNTER — Ambulatory Visit (INDEPENDENT_AMBULATORY_CARE_PROVIDER_SITE_OTHER): Payer: BC Managed Care – PPO | Admitting: Gastroenterology

## 2011-05-21 ENCOUNTER — Encounter: Payer: Self-pay | Admitting: Gastroenterology

## 2011-05-21 DIAGNOSIS — K7689 Other specified diseases of liver: Secondary | ICD-10-CM

## 2011-05-21 DIAGNOSIS — K760 Fatty (change of) liver, not elsewhere classified: Secondary | ICD-10-CM

## 2011-05-21 DIAGNOSIS — R4589 Other symptoms and signs involving emotional state: Secondary | ICD-10-CM

## 2011-05-21 DIAGNOSIS — E669 Obesity, unspecified: Secondary | ICD-10-CM

## 2011-05-21 DIAGNOSIS — R109 Unspecified abdominal pain: Secondary | ICD-10-CM

## 2011-05-21 DIAGNOSIS — K3184 Gastroparesis: Secondary | ICD-10-CM

## 2011-05-21 DIAGNOSIS — K5900 Constipation, unspecified: Secondary | ICD-10-CM

## 2011-05-21 DIAGNOSIS — F329 Major depressive disorder, single episode, unspecified: Secondary | ICD-10-CM

## 2011-05-21 NOTE — Patient Instructions (Signed)
You have been scheduled for an endoscopy and colonoscopy with propofol. Please follow the written instructions given to you at your visit today. Please pick up your prep at the pharmacy within the next 1-3 days.  

## 2011-05-21 NOTE — Progress Notes (Signed)
History of Present Illness:  This is a very complicated 36 year old Caucasian female on multiple medications with multiple drug allergies who presents with 6 months of abdominal discomfort, gas, bloating, and obstipation despite treatment with Amitza and Linzess 290 mg a day per [facility]. Review of her labs shows normal CBC a metabolic profile. Ultrasound upper abdomen show no evidence of fatty infiltration of the liver with small renal cysts. Patient also has polycystic ovary disease and is on hormonal therapy. Despite all his complaints of early satiety, constipation, and postprandial gas and bloating, she has gained 20 pounds in weight. She has not had previous endoscopy or colonoscopy. Previous surgical procedures include 2 cesarean sections. She does have a history of a nonobstructive umbilical hernia, thrombocytosis, and chronic anxiety and depression with use of BuSpar, Klonopin, Cymbalta, and Lexapro. She is seeing Dr. Maple Hudson in pulmonary for asthmatic bronchitis. List her multiple drug medications including penicillin. Family history is remarkable for GI motility disturbances in her father. The patient denies a prolonged viral illness before her symptomatology began 6 months ago. The patient relates that one year ago she had no significant GI issues, but review of her records does show a diagnosis of chronic irritable bowel syndrome. There is no history of melena, rectal bleeding, or any specific hepatobiliary complaints. She does complain of fatigue, and apparently has a diagnosis of fibromyalgia. She is on multiple inhalers, Norvasc, and Topamax.  I have reviewed this patient's present history, medical and surgical past history, allergies and medications.     ROS: The remainder of the 10 point ROS is negative     Physical Exam: Blood pressure 122/70, pulse 68, weight 181 pounds with BMI of 35.35. General well developed well nourished patient in no acute distress, appearing  their stated age Eyes PERRLA, no icterus, fundoscopic exam per opthamologist Skin no lesions noted Neck supple, no adenopathy, no thyroid enlargement, no tenderness Chest clear to percussion and auscultation Heart no significant murmurs, gallops or rubs noted Abdomen no hepatosplenomegaly masses or tenderness, BS normal. Obese abdomen with small umbilical amount incarcerated hernia. Bowel sounds are very hypoactive. Rectal inspection normal no fissures, or fistulae noted.  No masses or tenderness on digital exam. Stool guaiac negative. No impaction present. Rectal tone appears intact. Extremities no acute joint lesions, edema, phlebitis or evidence of cellulitis. Neurologic patient oriented x 3, cranial nerves intact, no focal neurologic deficits noted. Psychological mental status normal and normal affect. Somewhat depressed affect.  Assessment and plan: Probable diffuse GI motility disorder and possible secondary bacterial overgrowth syndrome. I have scheduled endoscopy and colonoscopy ASAP, and I suspect she will also need gastric emptying scan and followup more aggressive prokinetics. Also she may benefit from a 2 week course of Xifaxan and probiotics. I doubt she has true polycystic kidney disease with very small renal cysts and normal GFR. She is obese, and ultrasound showed fatty infiltration of the liver. Otherwise liver function tests have been normal. Previous trials of twice a day Nexium have not benefited her acid reflux symptoms. Also review of her labs shows a platelet count of 407,000 which would not qualify as thrombocytosis. She relates that thyroid function tests have been checked and were normal. Apparently she also is under the care of a rheumatologist, and has a weakly positive ANA. There is no history of Raynaud's phenomenon or other symptoms of collagen vascular disease. She appears mildly depressed despite therapy with Lexapro and Cymbalta.  Encounter Diagnoses  Name Primary?    Marland Kitchen  Constipation Yes  . Gastroparesis   . Abdominal pain

## 2011-05-22 ENCOUNTER — Encounter: Payer: Self-pay | Admitting: Gastroenterology

## 2011-05-22 ENCOUNTER — Ambulatory Visit (AMBULATORY_SURGERY_CENTER): Payer: BC Managed Care – PPO | Admitting: Gastroenterology

## 2011-05-22 ENCOUNTER — Other Ambulatory Visit: Payer: Self-pay | Admitting: Gastroenterology

## 2011-05-22 VITALS — BP 141/91 | HR 111 | Temp 99.0°F | Resp 20 | Ht 60.0 in | Wt 181.0 lb

## 2011-05-22 DIAGNOSIS — R109 Unspecified abdominal pain: Secondary | ICD-10-CM

## 2011-05-22 DIAGNOSIS — D126 Benign neoplasm of colon, unspecified: Secondary | ICD-10-CM

## 2011-05-22 DIAGNOSIS — K3184 Gastroparesis: Secondary | ICD-10-CM

## 2011-05-22 DIAGNOSIS — K219 Gastro-esophageal reflux disease without esophagitis: Secondary | ICD-10-CM

## 2011-05-22 DIAGNOSIS — K5900 Constipation, unspecified: Secondary | ICD-10-CM

## 2011-05-22 DIAGNOSIS — D1330 Benign neoplasm of unspecified part of small intestine: Secondary | ICD-10-CM

## 2011-05-22 MED ORDER — AMBULATORY NON FORMULARY MEDICATION: Status: DC

## 2011-05-22 MED ORDER — SODIUM CHLORIDE 0.9 % IV SOLN: 500.0000 mL | INTRAVENOUS | Status: DC

## 2011-05-22 NOTE — Op Note (Signed)
 [facility] [address] [place], [place]  [address]  COLONOSCOPY PROCEDURE REPORT  PATIENT:  Diane Ortiz, Diane Ortiz  MR#:  [id number] BIRTHDATE:  Aug 06, 1975, 35 yrs. old  GENDER:  female ENDOSCOPIST:  Vania Rea. Jarold Motto, MD, [facility] REF. BY: PROCEDURE DATE:  05/22/2011 PROCEDURE:  Colonoscopy with biopsy and snare polypectomy ASA CLASS:  Class II INDICATIONS:  change in bowel habits, constipation, Routine Risk Screening MEDICATIONS:   propofol (Diprivan) 240 mg IV  DESCRIPTION OF PROCEDURE:   After the risks and benefits and of the procedure were explained, informed consent was obtained. Digital rectal exam was performed and revealed no abnormalities. The LB CF-H180AL [id number] endoscope was introduced through the anus and advanced to the cecum, which was identified by both the appendix and ileocecal valve.  The quality of the prep was good, using MoviPrep.  The instrument was then slowly withdrawn as the colon was fully examined. <<PROCEDUREIMAGES>>  FINDINGS:  Two polyps were found in the rectum and sigmoid colon. COLD SNARE AND BIOPSY OF LEFT COLON POLYPS.  This was otherwise a normal examination of the colon.   Retroflexed views in the rectum revealed no abnormalities.    The scope was then withdrawn from the patient and the procedure completed.  COMPLICATIONS:  None ENDOSCOPIC IMPRESSION: 1) Two polyps in the rectum and sigmoid colon 2) Otherwise normal examination R/O ADENOMAS RECOMMENDATIONS: 1) Await biopsy results 2) Repeat colonoscopy in 5 years if polyp adenomatous; otherwise 10 years  REPEAT EXAM:  No  ______________________________ Vania Rea. Jarold Motto, MD, [facility]  CC:  n. eSIGNED:   Vania Rea. Remas Sobel at 05/22/2011 04:25 PM  Chrystine Oiler, [phone]

## 2011-05-22 NOTE — Patient Instructions (Signed)

## 2011-05-22 NOTE — Op Note (Signed)
[facility] [address] [place], [place]  [address]  ENDOSCOPY PROCEDURE REPORT  PATIENT:  Diane Ortiz, Diane Ortiz  MR#:  [id number] BIRTHDATE:  1975-04-14, 35 yrs. old  GENDER:  female  ENDOSCOPIST:  Vania Rea. Jarold Motto, MD, [facility] Referred by:  PROCEDURE DATE:  05/22/2011 PROCEDURE:  EGD with biopsy for H. pylori 78295, EGD with biopsy, 43239 ASA CLASS:  Class II INDICATIONS:  nausea and vomiting  MEDICATIONS:   propofol (Diprivan) 120 mg IV, There was residual sedation effect present from prior procedure. TOPICAL ANESTHETIC:  DESCRIPTION OF PROCEDURE:   After the risks and benefits of the procedure were explained, informed consent was obtained.  The LB GIF-H180 [id number] endoscope was introduced through the mouth and advanced to the second portion of the duodenum.  The instrument was slowly withdrawn as the mucosa was fully examined. <<PROCEDUREIMAGES>>  A hiatal hernia was found. 5 CM. HH NOTED  Normal duodenal folds were noted. SI BX. DONE.  The esophagus and gastroesophageal junction were completely normal in appearance.  The stomach was entered and closely examined. The antrum, angularis, and lesser curvature were well visualized, including a retroflexed view of the cardia and fundus. The stomach wall was normally distensable. The scope passed easily through the pylorus into the duodenum. SOME RETAINED FOOD PRODUCTS NOTED.    Retroflexed views revealed a hiatal hernia.    The scope was then withdrawn from the patient and the procedure completed.  COMPLICATIONS:  None  ENDOSCOPIC IMPRESSION: 1) Hiatal hernia 2) Normal duodenal folds 3) Normal esophagus 4) Normal stomach 5) A hiatal hernia SUSPECTED GASTROPARESIS ANDD GI MOTILITY DISORDER. RECOMMENDATIONS: DOMPERIDOME 10MG  AC AND QHS.GASTROPARESIS DIET.  ______________________________ Vania Rea. Jarold Motto, MD, [facility]  CC:  n. eSIGNED:   Vania Rea. Captola Teschner at 05/22/2011 04:31 PM  Chrystine Oiler,  [phone]

## 2011-05-22 NOTE — Progress Notes (Signed)
Patient did not experience any of the following events: a burn prior to discharge; a fall within the facility; wrong site/side/patient/procedure/implant event; or a hospital transfer or hospital admission upon discharge from the facility. (G8907) Patient did not have preoperative order for IV antibiotic SSI prophylaxis. (G8918)  

## 2011-05-25 ENCOUNTER — Telehealth: Payer: Self-pay | Admitting: *Deleted

## 2011-05-25 ENCOUNTER — Encounter: Payer: Self-pay | Admitting: Gastroenterology

## 2011-05-25 LAB — HELICOBACTER PYLORI SCREEN-BIOPSY: UREASE: NEGATIVE

## 2011-05-25 NOTE — Telephone Encounter (Signed)
Left message

## 2011-05-28 ENCOUNTER — Telehealth: Payer: Self-pay | Admitting: Gastroenterology

## 2011-05-28 NOTE — Telephone Encounter (Signed)
Informed pt I spoke with [facility] and they attempted to reach pt by her home number and it rang, but no answering machine. Gave them pt's cell and they were calling her during our conversation. Pt will call for further problems.

## 2011-05-29 ENCOUNTER — Encounter: Payer: Self-pay | Admitting: Gastroenterology

## 2011-06-04 ENCOUNTER — Telehealth: Payer: Self-pay | Admitting: Gastroenterology

## 2011-06-04 NOTE — Telephone Encounter (Signed)
Informed pt her bx were benign and she will receive a letter about recalls; pt stated understanding.

## 2011-09-17 ENCOUNTER — Telehealth: Payer: Self-pay | Admitting: Critical Care Medicine

## 2011-09-17 NOTE — Telephone Encounter (Signed)
I called cell # and was advised by a man that I had the wrong #. I called home # and had to lmomtcb x1

## 2011-09-18 ENCOUNTER — Encounter: Payer: Self-pay | Admitting: Adult Health

## 2011-09-18 ENCOUNTER — Ambulatory Visit (INDEPENDENT_AMBULATORY_CARE_PROVIDER_SITE_OTHER): Payer: BC Managed Care – PPO | Admitting: Adult Health

## 2011-09-18 VITALS — BP 118/88 | Temp 99.0°F | Ht 60.0 in | Wt 173.2 lb

## 2011-09-18 DIAGNOSIS — J45909 Unspecified asthma, uncomplicated: Secondary | ICD-10-CM

## 2011-09-18 MED ORDER — BECLOMETHASONE DIPROPIONATE 80 MCG/ACT IN AERS: 1.0000 | INHALATION_SPRAY | Freq: Two times a day (BID) | RESPIRATORY_TRACT | Status: DC

## 2011-09-18 MED ORDER — BECLOMETHASONE DIPROPIONATE 80 MCG/ACT IN AERS: 2.0000 | INHALATION_SPRAY | Freq: Two times a day (BID) | RESPIRATORY_TRACT | Status: DC

## 2011-09-18 MED ORDER — LEVALBUTEROL HCL 0.63 MG/3ML IN NEBU
0.6300 mg | INHALATION_SOLUTION | Freq: Once | RESPIRATORY_TRACT | Status: AC
  Administered 2011-09-18: 0.63 mg via RESPIRATORY_TRACT

## 2011-09-18 NOTE — Progress Notes (Signed)
-   36 year old female never smoker, history chronic asthma, allergic rhinitis, food allergy complicated by thrombocytosis, polycystic kidney disease, GERD   09/18/2011 Acute OV  Complains of increased SOB, wheezing, tightness in chest x2 months, worse since yesterday.  has been to [facility]  x2 w/ pred taper rx.  Does not like to take steroids , so did not take.  Has been off QVAR for > 1year. Says over last 3-4 months more  Asthma flares with cough and wheezing.  Seen yesterday with URI at UC , rx abx ? Name No records available.  Says her son has been sick    ROS:  Constitutional:   No  weight loss, night sweats,  Fevers, chills,  +fatigue, or  lassitude.  HEENT:   No headaches,  Difficulty swallowing,  Tooth/dental problems, or  Sore throat,                No sneezing, itching, ear ache,  +nasal congestion, post nasal drip,   CV:  No chest pain,  Orthopnea, PND, swelling in lower extremities, anasarca, dizziness, palpitations, syncope.   GI  No heartburn, indigestion, abdominal pain, nausea, vomiting, diarrhea, change in bowel habits, loss of appetite, bloody stools.   Resp:    No coughing up of blood.     No chest wall deformity  Skin: no rash or lesions.  GU: no dysuria, change in color of urine, no urgency or frequency.  No flank pain, no hematuria   MS:  No joint pain or swelling.  No decreased range of motion.  No back pain.  Psych:  No change in mood or affect. No depression or anxiety.  No memory loss.        O:  GEN: A/Ox3; pleasant , NAD  HEENT:  [place]/AT,  EACs-clear, TMs-wnl, NOSE-clear drainage, THROAT-clear, no lesions, no postnasal drip or exudate noted.   NECK:  Supple w/ fair ROM; no JVD; normal carotid impulses w/o bruits; no thyromegaly or nodules palpated; no lymphadenopathy.  RESP  Coarse BS  w/o, wheezes/ rales/ or rhonchi.no accessory muscle use, no dullness to percussion  CARD:  RRR, no m/r/g  , no peripheral edema, pulses intact, no cyanosis or  clubbing.  GI:   Soft & nt; nml bowel sounds; no organomegaly or masses detected.  Musco: Warm bil, no deformities or joint swelling noted.   Neuro: alert, no focal deficits noted.    Skin: Warm, no lesions or rashes

## 2011-09-18 NOTE — Telephone Encounter (Signed)
Pt returned call. You can reach her at [phone]. Diane Ortiz

## 2011-09-18 NOTE — Telephone Encounter (Signed)
Called and spoke with patient patient requesting refills on inhalers but unsure of what she was taking that was helping with he symptom relief. Patient stated that she does not take inhaler through the summer bnut not that fall is coming she needs inhalers.  Patient last seen Dr. Delford Field in 2011 and Dr. Maple Hudson for allergies 10/2010.  Informed patient that it is not possible to refill inhalers when she has not been seen and since she does not know what inhalers she is needing/using.  Offered OV with [name] P today to discuss her medications and get this figured out, patient accepted. Scheduled to come in today at 4pm.  Nothing further needed at this time.

## 2011-09-18 NOTE — Patient Instructions (Addendum)
Begin QVAR 2 puffs Twice daily, brush rinse and gargle May use ProAir HFA  2 puffs every 4 hours as needed. For wheezing or shortness of breath. follow up Dr. Delford Field  In 6 weeks and As needed   Please contact office for sooner follow up if symptoms do not improve or worsen or seek emergency care

## 2011-09-18 NOTE — Assessment & Plan Note (Signed)
Mod persistent Asthma with frequent exacerbation requiring steroids   Plan  xopenex neb in office  Begin QVAR 80 2 puffs Twice daily   follow up 6 wks  Please contact office for sooner follow up if symptoms do not improve or worsen or seek emergency care

## 2011-09-22 ENCOUNTER — Telehealth: Payer: Self-pay | Admitting: Critical Care Medicine

## 2011-09-22 NOTE — Telephone Encounter (Signed)
I spoke with pt and she stated since starting the QVAR Friday night she has experienced sore throat, dry cough, and hoarseness. Yesterday was the worst out of all the days. She is rinsing/gargling her mouth out after each use. Pt is requesting further recs. Please advise [name], thanks   Last OV 09/18/11 w/ TP Next OV 10/17/ 13 w/ PW

## 2011-09-22 NOTE — Telephone Encounter (Signed)
Salt water gargles As needed   Use baking soda/peroxide toothpaste after use, rinse and gargle.  Then drink water see if this resolves in next 1-2 days, if not call back will change rx.  Please contact office for sooner follow up if symptoms do not improve or worsen or seek emergency care

## 2011-09-22 NOTE — Telephone Encounter (Signed)
o

## 2011-09-22 NOTE — Telephone Encounter (Signed)
Pt advised of recs.Jennifer Castillo, CMA  

## 2011-09-22 NOTE — Telephone Encounter (Signed)
Pt stated she is returning a nurse's call.  Diane Ortiz

## 2011-09-24 ENCOUNTER — Telehealth: Payer: Self-pay | Admitting: Critical Care Medicine

## 2011-09-24 NOTE — Telephone Encounter (Signed)
Stop Qvar for now. Needs an OV to regroup

## 2011-09-24 NOTE — Telephone Encounter (Signed)
LMOMTCB x 1 

## 2011-09-24 NOTE — Telephone Encounter (Signed)
I spoke with the pt and she called on 09-22-11 c/o sore throat, dry cough and hoarseness and she was advised by TP the following: PARRETT,[name], NP 09/22/2011 3:52 PM Signed  Salt water gargles As needed  Use baking soda/peroxide toothpaste after use, rinse and gargle.  Then drink water see if this resolves in next 1-2 days, if not call back will change rx.  Please contact office for sooner follow up if symptoms do not improve or worsen or seek emergency care   Pt states she has done everything advised but she is still having sore throat, dry cough that is worse at night, hoarseness, and her tongue is white. Pt states she has a fluconazole tablet left over from prev rx and took this as well as some MMW that she had left over and this relieved the symptoms for about a day, but they have returned and are worse. Pt wants to know if there is another inhaler she can try in place of the qvar and what other recs you may have for these symptoms. TP is out of the office so I will forward to Dr. Delford Field. Carron Curie, CMA Allergies  Allergen Reactions  . Ivp Dye (Iodinated Diagnostic Agents)   . Shellfish Allergy   . Strawberry   . Aspirin     REACTION: Wheezing  . Cephalexin     REACTION: swelling  . Ibuprofen     REACTION: due to kidney disease  . Penicillins     REACTION: cough

## 2011-09-24 NOTE — Telephone Encounter (Signed)
Use the albuterol prn Wait until OV for other recs

## 2011-09-24 NOTE — Telephone Encounter (Signed)
Called spoke with patient, advised to stop the qvar for now as directed by PW.  Pt requested to be seen today or next Thursday as she is off on Thursdays.  Pt does not want to see TP.  Ov scheduled w/ CY 9.12.13 @ 0900.  Dr Delford Field, pt is requesting to know of any recs to hold her until her ov as far as medications are concerned.  She does have an albuterol hfa.  Thanks.

## 2011-09-25 NOTE — Telephone Encounter (Signed)
lmomtcb x 2  

## 2011-09-28 NOTE — Telephone Encounter (Signed)
LMTCB x 3 

## 2011-09-28 NOTE — Telephone Encounter (Signed)
Spoke with pt and notified of recs per PW. She verbalized understanding. Will keep pending ov for further recs.

## 2011-09-28 NOTE — Telephone Encounter (Signed)
[phone] pt is on lunch right now please call

## 2011-10-01 ENCOUNTER — Other Ambulatory Visit: Payer: BC Managed Care – PPO

## 2011-10-01 ENCOUNTER — Ambulatory Visit (INDEPENDENT_AMBULATORY_CARE_PROVIDER_SITE_OTHER): Payer: BC Managed Care – PPO | Admitting: Internal Medicine

## 2011-10-01 ENCOUNTER — Ambulatory Visit (INDEPENDENT_AMBULATORY_CARE_PROVIDER_SITE_OTHER): Payer: BC Managed Care – PPO

## 2011-10-01 ENCOUNTER — Encounter: Payer: Self-pay | Admitting: Internal Medicine

## 2011-10-01 ENCOUNTER — Telehealth: Payer: Self-pay | Admitting: Internal Medicine

## 2011-10-01 VITALS — BP 108/64 | HR 106 | Ht 60.0 in | Wt 173.8 lb

## 2011-10-01 DIAGNOSIS — J45909 Unspecified asthma, uncomplicated: Secondary | ICD-10-CM

## 2011-10-01 DIAGNOSIS — B370 Candidal stomatitis: Secondary | ICD-10-CM

## 2011-10-01 DIAGNOSIS — J309 Allergic rhinitis, unspecified: Secondary | ICD-10-CM

## 2011-10-01 DIAGNOSIS — J3089 Other allergic rhinitis: Secondary | ICD-10-CM

## 2011-10-01 DIAGNOSIS — J45998 Other asthma: Secondary | ICD-10-CM

## 2011-10-01 LAB — IGG, IGA, IGM: IgG (Immunoglobin G), Serum: 1010 mg/dL (ref 690–1700)

## 2011-10-01 MED ORDER — EPINEPHRINE 0.3 MG/0.3ML IJ DEVI: 0.3000 mg | Freq: Once | INTRAMUSCULAR | Status: DC

## 2011-10-01 NOTE — Telephone Encounter (Signed)
Epipen RX sent to pharmacy-pt aware.   [name],   Pt would like to have her vaccine mailed to her.

## 2011-10-01 NOTE — Progress Notes (Signed)
11/13/10- 36 year old female never smoker, history chronic asthma, allergic rhinitis, food allergy complicated by thrombocytosis, polycystic kidney disease, GERD Last here 06/03/2009 Has had flu vaccine. Continues allergy vaccine. History of symptoms flare when she was taken off of allergy vaccine at the [facility] office, before restarting here. She is having little drainage now. Currently on Levaquin for pyelonephritis. Being followed elsewhere for abnormal liver function and white blood count over the past year. Taken off of blood pressure medicines and her pressure stays down. Mentions being tired in the daytime. Feels that she is sleeping well at night and denies snoring. Admits this might be part of her depression. I recommended she discuss it with her psychiatrist.  09/18/2011 Acute OV  Complains of increased SOB, wheezing, tightness in chest x2 months, worse since yesterday.  has been to UC  x2 w/ pred taper rx.  Does not like to take steroids , so did not take.  Has been off QVAR for > 1year. Says over last 3-4 months more  Asthma flares with cough and wheezing.  Seen yesterday with URI at UC , rx abx ? Name No records available.  Says her son has been sick    10/01/11- 36 year old female never smoker, history chronic asthma, allergic rhinitis, food allergy,  complicated by thrombocytosis, polycystic kidney disease, GERD Stopped Qvar-extreme throat and thrush; unable to sleep at night. Needs another inhaler. Singulair no help. Followed for leukocytosis by hematologist. Had seen infectious disease specialist with past workup negative. Increased seasonal rhinitis symptoms  Allergy vaccine 1:50  injections were being given by her mother and she says they were helpful. However I pointed out that she last ordered vaccine a year ago and therefore wasn't really using it. I told her I was going to stop it she became quite upset. FENO assay 21  ROS-see HPI Constitutional:   No-    weight loss, night sweats, fevers, chills, +fatigue, lassitude. HEENT:   No-  headaches, difficulty swallowing, tooth/dental problems, sore throat,       + sneezing, itching, ear ache,  Little-nasal congestion, post nasal drip,  CV:  No-   chest pain, orthopnea, PND, swelling in lower extremities, anasarca, dizziness, palpitations Resp: No-   shortness of breath with exertion or at rest.              No-   productive cough,  No non-productive cough,  No- coughing up of blood.              No-   change in color of mucus.  + Occasional wheezing.   Skin: No-   rash or lesions. GI:  No-   heartburn, indigestion, abdominal pain, nausea, vomiting,  GU:  MS:  No-   joint pain or swelling.   Neuro-     nothing unusual Psych:  No- change in mood or affect. No depression or anxiety.  No memory loss.  OBJ General- Alert, Oriented, Affect-appropriate, Distress- none acute; weight Skin- rash-none, lesions- none, excoriation- none Lymphadenopathy- none Head- atraumatic            Eyes- Gross vision intact, PERRLA, conjunctivae clear secretions            Ears- Hearing, canals-normal            Nose- Clear, no-Septal dev, mucus, polyps, erosion, perforation             Throat- Mallampati II , + tongue lightly coated , drainage- none, tonsils- atrophic Neck- flexible ,  trachea midline, no stridor , thyroid nl, carotid no bruit Chest - symmetrical excursion , unlabored           Heart/CV- RRR , no murmur , no gallop  , no rub, nl s1 s2                           - JVD- none , edema- none, stasis changes- none, varices- none           Lung- clear to P&A, wheeze- none, cough- none , dullness-none, rub- none           Chest wall-  Abd-  Br/ Gen/ Rectal- Not done, not indicated Extrem- cyanosis- none, clubbing, none, atrophy- none, strength- nl Neuro- grossly intact to observation

## 2011-10-01 NOTE — Telephone Encounter (Signed)
Okay. I'll make up her vaccine and send it to her.

## 2011-10-01 NOTE — Patient Instructions (Addendum)
Order- lab - FENO assay today             La b- Immunoglobulin assay IgE, IgG, IgA, IgM     Dx asthma  Script sent for diflucan for yeast  Try otc Nasalcrom/ cromolyn for allergic nose. You can also take antihistamines

## 2011-10-06 ENCOUNTER — Telehealth: Payer: Self-pay | Admitting: Internal Medicine

## 2011-10-06 NOTE — Telephone Encounter (Signed)
Notes Recorded by Waymon Budge, MD on 10/06/2011 at 9:07 AM Normal immunoglobulin levels. This test of immune system is normal.   I spoke with patient about results and she verbalized understanding and had no questions

## 2011-10-08 ENCOUNTER — Telehealth: Payer: Self-pay | Admitting: Internal Medicine

## 2011-10-08 NOTE — Telephone Encounter (Signed)
If she still needs it: Ok to send Diflucan 150 mg, # 3, 1 daily x 3 days   Ref x 1.

## 2011-10-08 NOTE — Telephone Encounter (Signed)
LMOMTCB x 1. Wul send if pt still needs this.

## 2011-10-08 NOTE — Telephone Encounter (Signed)
Per last OV note: Order- lab - FENO assay today  La b- Immunoglobulin assay IgE, IgG, IgA, IgM Dx asthma  Script sent for diflucan for yeast  Try otc Nasalcrom/ cromolyn for allergic nose. You can also take antihistamines  RX for diflucan was not sent. Please advise directions Dr. Maple Hudson thanks

## 2011-10-09 MED ORDER — FLUCONAZOLE 150 MG PO TABS: 150.0000 mg | ORAL_TABLET | Freq: Once | ORAL | Status: DC

## 2011-10-09 NOTE — Telephone Encounter (Signed)
Pt called back-states she still needs RX and is aware that I have sent to pharmacy.

## 2011-10-09 NOTE — Telephone Encounter (Signed)
LMTCB

## 2011-10-10 DIAGNOSIS — B370 Candidal stomatitis: Secondary | ICD-10-CM | POA: Insufficient documentation

## 2011-10-10 NOTE — Assessment & Plan Note (Signed)
Plan-spacer, Diflucan

## 2011-10-10 NOTE — Assessment & Plan Note (Signed)
Fairly good control. Can't tolerate steroid inhaler. We discussed options. AeroChamber.

## 2011-10-10 NOTE — Assessment & Plan Note (Signed)
Plan-IgE, immunoglobulins, Nasalcrom

## 2011-10-15 ENCOUNTER — Encounter: Payer: Self-pay | Admitting: Internal Medicine

## 2011-11-05 ENCOUNTER — Ambulatory Visit: Payer: BC Managed Care – PPO | Admitting: Critical Care Medicine

## 2011-11-17 ENCOUNTER — Telehealth: Payer: Self-pay | Admitting: Internal Medicine

## 2011-11-17 MED ORDER — LEVOCETIRIZINE DIHYDROCHLORIDE 5 MG PO TABS: 5.0000 mg | ORAL_TABLET | Freq: Every day | ORAL | Status: DC | PRN

## 2011-11-17 NOTE — Telephone Encounter (Signed)
Pharmacy requesting  levocetirizine 5mg  <>take 1 tablet every evening as needed.  #30 X5. Last fill was 10-17-11 Allergies  Allergen Reactions  . Ivp Dye (Iodinated Diagnostic Agents)   . Qvar (Beclomethasone)     Thrush, sore throat, unable to sleep  . Shellfish Allergy   . Strawberry   . Aspirin     REACTION: Wheezing  . Cephalexin     REACTION: swelling  . Ibuprofen     REACTION: due to kidney disease  . Penicillins     REACTION: cough   Dr Maple Hudson  Please advise

## 2011-11-17 NOTE — Telephone Encounter (Signed)
RX has been sent to the pharmacy.

## 2011-11-17 NOTE — Telephone Encounter (Signed)
Ok to refill xyzal 5 mg, # 30, 1 daily as needed, refill prn

## 2011-12-09 ENCOUNTER — Telehealth: Payer: Self-pay | Admitting: Critical Care Medicine

## 2011-12-09 NOTE — Telephone Encounter (Signed)
I left message for patient that she needs to follow up with CY only per PW. If any questions or concerns please call our office.

## 2011-12-09 NOTE — Telephone Encounter (Signed)
Since now with CDY f/u with him

## 2011-12-09 NOTE — Telephone Encounter (Signed)
Pt was seen by TP 8.30.13 for acute visit > was started on qvar 2 puffs bid, follow up in 6 weeks.    Saw CY for acute visit 9.12.13 and qvar was stopped:  10/01/11- 36 year old female never smoker, history chronic asthma, allergic rhinitis, food allergy, complicated by thrombocytosis, polycystic kidney disease, GERD  Stopped Qvar-extreme throat and thrush; unable to sleep at night. Needs another inhaler. Singulair no help.  Followed for leukocytosis by hematologist. Had seen infectious disease specialist with past workup negative.  Increased seasonal rhinitis symptoms  Allergy vaccine 1:50 injections were being given by her mother and she says they were helpful. However I pointed out that she last ordered vaccine a year ago and therefore wasn't really using it. I told her I was going to stop it she became quite upset.  FENO assay 21  Patient sees CY fairly regularly for allergy follow up.  However, pt has not been seen by PW for her asthma since 7.22.11.    Dr Delford Field, please advise if you would like pt to reschedule her follow up, or if follow ups with Dr Maple Hudson is sufficient.  Thanks.

## 2011-12-10 ENCOUNTER — Ambulatory Visit: Payer: BC Managed Care – PPO | Admitting: Critical Care Medicine

## 2012-01-28 ENCOUNTER — Other Ambulatory Visit: Payer: Self-pay | Admitting: Gastroenterology

## 2012-01-29 ENCOUNTER — Other Ambulatory Visit: Payer: Self-pay | Admitting: Gastroenterology

## 2012-01-29 MED ORDER — AMBULATORY NON FORMULARY MEDICATION: Status: DC

## 2012-01-29 NOTE — Telephone Encounter (Signed)
Faxed prescription. Patient notified.

## 2012-03-18 ENCOUNTER — Ambulatory Visit (INDEPENDENT_AMBULATORY_CARE_PROVIDER_SITE_OTHER): Payer: BC Managed Care – PPO

## 2012-03-18 DIAGNOSIS — J309 Allergic rhinitis, unspecified: Secondary | ICD-10-CM

## 2012-08-02 ENCOUNTER — Telehealth: Payer: Self-pay | Admitting: Gastroenterology

## 2012-08-02 MED ORDER — AMBULATORY NON FORMULARY MEDICATION: Status: DC

## 2012-08-02 NOTE — Telephone Encounter (Signed)
RX sent   Patient notified  

## 2012-08-26 ENCOUNTER — Telehealth: Payer: Self-pay | Admitting: Gastroenterology

## 2012-08-26 MED ORDER — AMBULATORY NON FORMULARY MEDICATION: Status: DC

## 2012-08-26 NOTE — Telephone Encounter (Signed)
Reordered Domperidone from [facility] [place].

## 2012-08-31 ENCOUNTER — Ambulatory Visit: Payer: Self-pay

## 2012-10-24 ENCOUNTER — Telehealth: Payer: Self-pay | Admitting: Internal Medicine

## 2012-10-24 NOTE — Telephone Encounter (Signed)
I called Diane Ortiz and gave her your instructions. She's concerned that it's the new vials,because she hasn't had any problems until now. I told her to take the 0.3 next wk. And if she had anymore problems to call back.

## 2012-10-24 NOTE — Telephone Encounter (Signed)
I spoke with pt. She stated she has been giving herself allergy injections. With her new vial she started using (1st Sunday in September). 20 minutes later after the 1st injection she was feeling dizzy and slight disoriented. Last for about 1-2 hrs and just didn't feel good in general. She used the vial 2 weeks after that and then skipped a week. She reports then she did injection again last night and same symptoms happened again. She then fell asleep and does not remember falling asleep. She has not had this happened before and does not have any redness around the injection site. She is doing 0.5 for injection. Please advise Dr. Maple Hudson thanks

## 2012-10-24 NOTE — Telephone Encounter (Signed)
Redirecting to allergy lab. They can call to discuss. This would not be a common vaccine reaction, but just to be safe, would reduce dose to 0.3 ml/ vial for the next 2 times, then advance as tolerated.

## 2012-10-25 NOTE — Telephone Encounter (Signed)
Noted. We can always remix if concern persists.

## 2012-10-27 ENCOUNTER — Telehealth: Payer: Self-pay | Admitting: Internal Medicine

## 2012-10-27 NOTE — Telephone Encounter (Signed)
I don't know what this is, but we definitely will stop her shots for now at least, and I qould like to see her when we can get her in.

## 2012-10-27 NOTE — Telephone Encounter (Signed)
I spoke with pt and she stated she needs an appt that day 4 PM or after. Please advise Dr. Maple Hudson thanks

## 2012-10-27 NOTE — Telephone Encounter (Signed)
Per CY-have patient come in next week on Thursday 11-03-12 at 11:00am. Unless patient gets worse-then call here for sooner OV or if needed seek emergency care.

## 2012-10-27 NOTE — Telephone Encounter (Signed)
See if she can come in on Friday 11-04-12 at 4pm slot. Thanks.

## 2012-10-27 NOTE — Telephone Encounter (Signed)
LMTCB

## 2012-10-27 NOTE — Telephone Encounter (Signed)
I spoke with pt. She stated she can't come in on Friday. She is asking to come in 4:00 on Thursday. Please advise thanks

## 2012-10-27 NOTE — Telephone Encounter (Signed)
Spoke with the pt She states that she still does not feel right since received her last batch of allergy serum  She states that she does not feel dizzy anymore, but is sleeping all the time and feels bad in general  She states that [name] had rec that she decrease to 0.3, but she feels uneasy about using serum at all  Please advise, thanks! Allergies  Allergen Reactions  . Ivp Dye [Iodinated Diagnostic Agents]   . Qvar [Beclomethasone]     Thrush, sore throat, unable to sleep  . Shellfish Allergy   . Strawberry   . Aspirin     REACTION: Wheezing  . Cephalexin     REACTION: swelling  . Ibuprofen     REACTION: due to kidney disease  . Penicillins     REACTION: cough

## 2012-10-28 NOTE — Telephone Encounter (Signed)
LMTCB

## 2012-10-28 NOTE — Telephone Encounter (Signed)
Pt returned call & set appt for 11/03/12 at 4:00 PM.  Pt is aware there may be a delay.  Antionette Fairy

## 2012-10-28 NOTE — Telephone Encounter (Signed)
Okay to put patient on Thursday 11-03-12 at 4pm slot; however please let patient know that there might be a delay/longer wait time as she is being worked in. Thanks.

## 2012-11-03 ENCOUNTER — Ambulatory Visit: Payer: Self-pay | Admitting: Internal Medicine

## 2012-12-03 ENCOUNTER — Other Ambulatory Visit: Payer: Self-pay | Admitting: Internal Medicine

## 2013-02-23 ENCOUNTER — Other Ambulatory Visit: Payer: BC Managed Care – PPO

## 2013-02-23 ENCOUNTER — Encounter: Payer: Self-pay | Admitting: Gastroenterology

## 2013-02-23 ENCOUNTER — Ambulatory Visit (INDEPENDENT_AMBULATORY_CARE_PROVIDER_SITE_OTHER): Payer: BC Managed Care – PPO | Admitting: Gastroenterology

## 2013-02-23 VITALS — BP 102/74 | HR 88 | Ht 60.0 in | Wt 149.4 lb

## 2013-02-23 DIAGNOSIS — F3289 Other specified depressive episodes: Secondary | ICD-10-CM

## 2013-02-23 DIAGNOSIS — K598 Other specified functional intestinal disorders: Secondary | ICD-10-CM

## 2013-02-23 DIAGNOSIS — K599 Functional intestinal disorder, unspecified: Secondary | ICD-10-CM

## 2013-02-23 DIAGNOSIS — F32A Depression, unspecified: Secondary | ICD-10-CM

## 2013-02-23 DIAGNOSIS — F329 Major depressive disorder, single episode, unspecified: Secondary | ICD-10-CM

## 2013-02-23 DIAGNOSIS — K5989 Other specified functional intestinal disorders: Secondary | ICD-10-CM

## 2013-02-23 MED ORDER — AMBULATORY NON FORMULARY MEDICATION: Status: DC

## 2013-02-23 NOTE — Patient Instructions (Signed)
Your physician has requested that you go to the basement for the following lab work before leaving today: Celiac Panel  FODMAP Diet given today for your review  Domperidone was faxed to [facility] [place] [facility]

## 2013-02-23 NOTE — Progress Notes (Signed)
This is a very 38-year-old Caucasian female with chronic depression under control on medication.  She's had chronic abdominal gas, bloating, and bowel regularity with associated acid reflux symptoms all alleviated by taking domperidone 10 mg 3-4 times a day.  She says she does think she has gluten intolerance, but I cannot see a previous gluten serologies.  Is been no anorexia, weight loss, melena, hematochezia, or other specific upper, lower, or hepatobiliary symptoms.  She generally is feeling well and has mild complaints.  Previous abdominal ultrasound has been negative as as endoscopy and colonoscopy.  She did have a small adenoma is due for followup colonoscopy 2018.  Current Medications, Allergies, Past Medical History, Past Surgical History, Family History and Social History were reviewed in [employer] record.  ROS: All systems were reviewed and are negative unless otherwise stated in the HPI.          Physical Exam: Healthy-appearing patient in no distress.  Blood pressure 102/74, pulse 88 and regular, and weight 149 the BMI of 29.17.  Cannot appreciate stigmata of chronic liver disease.  Her chest is clear and she is in a regular rhythm without murmurs gallops or rubs.  There is no organomegaly, abdominal masses or tenderness.  Bowel sounds are normal.  Mental status is normal.  Peripheral extremities are unremarkable.    Assessment and Plan: Chronic IBS and otherwise healthy 38 year old patient sent for chronic anxiety and depression.  She does use occasional magnesium sulfate constipation.  She has polycystic ovary disease and takes daily metformin 500 mg twice a day along with her antidepressants antihypertensive medications.  She also is on hormonal therapy.  She's not had previous gastric emptying scan, but her GI complaints of all been markedly improved by domperidone trials.  This certainly suggests a chronic nonspecific GI motility disorder.  I renewed her  medications and we will check celiac serologic profile.  I have placed her on a FOD-MAP diet for IBS.

## 2013-02-24 ENCOUNTER — Telehealth: Payer: Self-pay | Admitting: Gastroenterology

## 2013-02-24 LAB — CELIAC PANEL 10
Endomysial Screen: NEGATIVE
Gliadin IgA: 6.8 U/mL (ref <20)
Gliadin IgG: 9.9 U/mL (ref <20)
IGA: 280 mg/dL (ref 69–380)
Tissue Transglut Ab: 13.3 U/mL (ref <20)
Tissue Transglutaminase Ab, IgA: 5 U/mL (ref <20)

## 2013-02-27 NOTE — Telephone Encounter (Signed)
Informed pt of negative Celiac Panel. She states she still feels better on a Gluten Free Diet. She states Dr Sharlett Iles suggested she f/u with Dr Hilarie Fredrickson in a year d/t her domperidone.

## 2013-02-27 NOTE — Telephone Encounter (Signed)
lmom for pt to call back

## 2013-04-06 ENCOUNTER — Other Ambulatory Visit: Payer: Self-pay | Admitting: Orthopedic Surgery

## 2013-04-06 DIAGNOSIS — M542 Cervicalgia: Secondary | ICD-10-CM

## 2013-04-09 ENCOUNTER — Other Ambulatory Visit: Payer: BC Managed Care – PPO

## 2013-04-12 ENCOUNTER — Ambulatory Visit
Admission: RE | Admit: 2013-04-12 | Discharge: 2013-04-12 | Disposition: A | Discharge status: Home / Self Care | Payer: BC Managed Care – PPO | Source: Ambulatory Visit | Attending: Orthopedic Surgery | Admitting: Orthopedic Surgery

## 2013-04-12 DIAGNOSIS — M542 Cervicalgia: Secondary | ICD-10-CM

## 2013-06-01 ENCOUNTER — Encounter (INDEPENDENT_AMBULATORY_CARE_PROVIDER_SITE_OTHER): Payer: Self-pay

## 2013-06-01 ENCOUNTER — Encounter: Payer: Self-pay | Admitting: Internal Medicine

## 2013-06-01 ENCOUNTER — Ambulatory Visit (INDEPENDENT_AMBULATORY_CARE_PROVIDER_SITE_OTHER): Payer: BC Managed Care – PPO | Admitting: Internal Medicine

## 2013-06-01 VITALS — BP 118/62 | HR 76 | Ht 60.0 in | Wt 151.4 lb

## 2013-06-01 DIAGNOSIS — J302 Other seasonal allergic rhinitis: Secondary | ICD-10-CM

## 2013-06-01 DIAGNOSIS — J309 Allergic rhinitis, unspecified: Secondary | ICD-10-CM

## 2013-06-01 DIAGNOSIS — J45998 Other asthma: Secondary | ICD-10-CM

## 2013-06-01 DIAGNOSIS — J45909 Unspecified asthma, uncomplicated: Secondary | ICD-10-CM

## 2013-06-01 DIAGNOSIS — L272 Dermatitis due to ingested food: Secondary | ICD-10-CM

## 2013-06-01 DIAGNOSIS — J3089 Other allergic rhinitis: Secondary | ICD-10-CM

## 2013-06-01 MED ORDER — EPINEPHRINE 0.3 MG/0.3ML IJ SOAJ: INTRAMUSCULAR | Status: DC

## 2013-06-01 MED ORDER — LEVOCETIRIZINE DIHYDROCHLORIDE 5 MG PO TABS: 5.0000 mg | ORAL_TABLET | Freq: Every day | ORAL | Status: DC | PRN

## 2013-06-01 MED ORDER — AZELASTINE-FLUTICASONE 137-50 MCG/ACT NA SUSP: 1.0000 | Freq: Every day | NASAL | Status: DC

## 2013-06-01 MED ORDER — BREO ELLIPTA 100-25 MCG/INH IN AEPB: 1.0000 | INHALATION_SPRAY | Freq: Every day | RESPIRATORY_TRACT | Status: DC

## 2013-06-01 MED ORDER — ALBUTEROL SULFATE HFA 108 (90 BASE) MCG/ACT IN AERS: 2.0000 | INHALATION_SPRAY | Freq: Four times a day (QID) | RESPIRATORY_TRACT | Status: DC | PRN

## 2013-06-01 NOTE — Patient Instructions (Addendum)
Sample Dymista nasal spray    1-2 puffs each nostril once daily at bedtime  Sample Breo Ellipta inhaler 1 puff before a meal, once daily   Maintenance inhaler  We can continue allergy shots 1:10 GO  Scripts refilling Epipen and albuterol HFA printed  Script for Xyzal sent  Please call as needed

## 2013-06-01 NOTE — Progress Notes (Signed)
11/13/10- 38 year old female never smoker, history chronic asthma, allergic rhinitis, food allergy complicated by thrombocytosis, polycystic kidney disease, GERD Last here 06/03/2009 Has had flu vaccine. Continues allergy vaccine. History of symptoms flare when she was taken off of allergy vaccine at the [facility], before restarting here. She is having little drainage now. Currently on Levaquin for pyelonephritis. Being followed elsewhere for abnormal liver function and white blood count over the past year. Taken off of blood pressure medicines and her pressure stays down. Mentions being tired in the daytime. Feels that she is sleeping well at night and denies snoring. Admits this might be part of her depression. I recommended she discuss it with her psychiatrist.  09/18/2011 Acute OV  Complains of increased SOB, wheezing, tightness in chest x2 months, worse since yesterday.  has been to UC  x2 w/ pred taper rx.  Does not like to take steroids , so did not take.  Has been off QVAR for > 1year. Says over last 3-4 months more  Asthma flares with cough and wheezing.  Seen yesterday with URI at UC , rx abx ? Name No records available.  Says her son has been sick    10/01/11- 38 year old female never smoker, history chronic asthma, allergic rhinitis, food allergy,  complicated by thrombocytosis, polycystic kidney disease, GERD Stopped Qvar-extreme throat and thrush; unable to sleep at night. Needs another inhaler. Singulair no help. Followed for leukocytosis by hematologist. Had seen infectious disease specialist with past workup negative. Increased seasonal rhinitis symptoms  Allergy vaccine 1:50  injections were being given by her mother and she says they were helpful. However I pointed out that she last ordered vaccine a year ago and therefore wasn't really using it. I told her I was going to stop it she became quite upset. FENO assay 21  06/01/13- 38 year old female never  smoker, history chronic asthma, allergic rhinitis, food allergy,  complicated by thrombocytosis, polycystic kidney disease, GERD FOLLOWS FOR:  Still on Allergy vaccine 1:50 GO and doing well.   She stopped allergy shots for a while in October of 2014 because she believed they were making her sleepy. She restarted, but needed to reduce volume to 0.1 mL profile per week during peak pollen season to avoid local reactions. Uses rescue inhaler before running. Xyzal every night, Benadryl if needed.  ROS-see HPI Constitutional:   No-   weight loss, night sweats, fevers, chills, +fatigue, lassitude. HEENT:   No-  headaches, difficulty swallowing, tooth/dental problems, sore throat,       + sneezing,+ itching, ear ache,  +Little-nasal congestion, post nasal drip,  CV:  No-   chest pain, orthopnea, PND, swelling in lower extremities, anasarca, dizziness, palpitations Resp: No-   shortness of breath with exertion or at rest.              No-   productive cough,  No non-productive cough,  No- coughing up of blood.              No-   change in color of mucus.  + Occasional wheezing.   Skin: No-   rash or lesions. GI:  No-   heartburn, indigestion, abdominal pain, nausea, vomiting,  GU:  MS:  No-   joint pain or swelling.   Neuro-     nothing unusual Psych:  No- change in mood or affect. No depression or anxiety.  No memory loss.  OBJ General- Alert, Oriented, Affect-appropriate, Distress- none acute; weight Skin- rash-none, lesions- none,  excoriation- none Lymphadenopathy- none Head- atraumatic            Eyes- Gross vision intact, PERRLA, conjunctivae clear secretions            Ears- Hearing, canals-normal            Nose- Clear, no-Septal dev, mucus, polyps, erosion, perforation             Throat- Mallampati II , + tongue lightly coated , drainage- none, tonsils- atrophic Neck- flexible , trachea midline, no stridor , thyroid nl, carotid no bruit Chest - symmetrical excursion , unlabored            Heart/CV- RRR , no murmur , no gallop  , no rub, nl s1 s2                           - JVD- none , edema- none, stasis changes- none, varices- none           Lung- clear to P&A, wheeze- none, cough+ dry , dullness-none, rub- none           Chest wall-  Abd-  Br/ Gen/ Rectal- Not done, not indicated Extrem- cyanosis- none, clubbing, none, atrophy- none, strength- nl Neuro- grossly intact to observation

## 2013-06-15 ENCOUNTER — Ambulatory Visit (INDEPENDENT_AMBULATORY_CARE_PROVIDER_SITE_OTHER): Payer: BC Managed Care – PPO

## 2013-06-15 ENCOUNTER — Telehealth: Payer: Self-pay | Admitting: Internal Medicine

## 2013-06-15 DIAGNOSIS — J309 Allergic rhinitis, unspecified: Secondary | ICD-10-CM

## 2013-06-15 NOTE — Telephone Encounter (Signed)
Dr. Annamaria Boots,  In your 06/01/2013 notes you said to cont. 1:10. She is currently on 1:50 at 0.5. She last ordered 08/31/12;she's still taking that.(Vac. Has been refrigerated and isn't cloudy.) Obviously she has missed several shots. Do I hold her at 1:50,0.5 or go to 1:10 at 0.1? Please advise. Pt. Is coming in around 3:00 to pick-up her vac. Today. Please let me know as soon as you can. Thanks, [employer]

## 2013-06-15 NOTE — Telephone Encounter (Signed)
Pt. Is coming to pick up vac. This afternoon.

## 2013-06-15 NOTE — Telephone Encounter (Signed)
Patient is requesting allergy serum--states that this was supposed to be mailed to her and she has not received this yet.  Spoke with Alroy Bailiff-- per [name], she will contact patient in regards to allergy serum.  Will send message to [name].

## 2013-06-15 NOTE — Telephone Encounter (Signed)
Pt. Has done well on 1:50. Made up 1:10, pt. Is coming to pick up her vac.. Build-up instructions are in with her vac.. I will also go over the instructions when she comes in.

## 2013-06-15 NOTE — Telephone Encounter (Signed)
As long as she is not having reactions, we can go to 1:10 and build per protocol.

## 2013-07-16 NOTE — Assessment & Plan Note (Signed)
No recent events. Avoids suspect foods.

## 2013-07-16 NOTE — Assessment & Plan Note (Signed)
Plan-sample Dymista, continue vaccine at 1:50 because of occasional local reactions

## 2013-07-16 NOTE — Assessment & Plan Note (Signed)
Adequate control. Plan-suggested trial of Breo for comparison

## 2013-11-22 ENCOUNTER — Encounter: Payer: Self-pay | Admitting: Internal Medicine

## 2013-12-22 ENCOUNTER — Ambulatory Visit (INDEPENDENT_AMBULATORY_CARE_PROVIDER_SITE_OTHER): Payer: BC Managed Care – PPO

## 2013-12-22 DIAGNOSIS — J309 Allergic rhinitis, unspecified: Secondary | ICD-10-CM

## 2014-07-03 ENCOUNTER — Telehealth: Payer: Self-pay | Admitting: Internal Medicine

## 2014-07-03 NOTE — Telephone Encounter (Signed)
lmtcb

## 2014-07-04 NOTE — Telephone Encounter (Signed)
I called Mrs.Loveall [email or web address] to see if she wants me to mail her vac. Or if she wants to pick it up. Waiting for pt. To call.

## 2014-07-04 NOTE — Telephone Encounter (Signed)
Message will be routed to [facility]. Allergy serum will need to be extracted.

## 2014-07-05 ENCOUNTER — Ambulatory Visit (INDEPENDENT_AMBULATORY_CARE_PROVIDER_SITE_OTHER): Payer: BLUE CROSS/BLUE SHIELD

## 2014-07-05 ENCOUNTER — Telehealth: Payer: Self-pay | Admitting: Internal Medicine

## 2014-07-05 DIAGNOSIS — J309 Allergic rhinitis, unspecified: Secondary | ICD-10-CM | POA: Diagnosis not present

## 2014-07-05 NOTE — Telephone Encounter (Signed)
Spoke to pt, states she gets off at 49 today and can pick up rx if it is ready today, otherwise mail it to her mother's address: [address] [place] [address]  [name] please call patient when extract is complete.  Thanks!

## 2014-07-06 NOTE — Telephone Encounter (Signed)
Called pt. And apologized for not getting her message til this morning. I let her know  I had already mailed her vac. Yesterday or the day before.

## 2014-08-03 NOTE — Telephone Encounter (Signed)
Date Mixed: 07/05/14 Vial: 1 Strength: 1:10l Here/Mail/Pick Up: mail Mixed By: Laurette Schimke

## 2014-09-07 ENCOUNTER — Encounter: Payer: Self-pay | Admitting: Internal Medicine

## 2014-09-20 ENCOUNTER — Ambulatory Visit: Payer: Self-pay | Admitting: Internal Medicine

## 2014-10-04 ENCOUNTER — Encounter: Payer: Self-pay | Admitting: Internal Medicine

## 2014-11-09 ENCOUNTER — Encounter: Payer: Self-pay | Admitting: *Deleted

## 2014-11-26 ENCOUNTER — Encounter: Payer: Self-pay | Admitting: Internal Medicine

## 2014-11-29 ENCOUNTER — Ambulatory Visit (INDEPENDENT_AMBULATORY_CARE_PROVIDER_SITE_OTHER): Payer: BLUE CROSS/BLUE SHIELD | Admitting: Internal Medicine

## 2014-11-29 ENCOUNTER — Encounter: Payer: Self-pay | Admitting: Internal Medicine

## 2014-11-29 VITALS — BP 118/84 | HR 80 | Ht 60.0 in | Wt 147.5 lb

## 2014-11-29 DIAGNOSIS — K599 Functional intestinal disorder, unspecified: Secondary | ICD-10-CM

## 2014-11-29 DIAGNOSIS — K589 Irritable bowel syndrome without diarrhea: Secondary | ICD-10-CM

## 2014-11-29 DIAGNOSIS — K30 Functional dyspepsia: Secondary | ICD-10-CM | POA: Diagnosis not present

## 2014-11-29 DIAGNOSIS — Z86010 Personal history of colonic polyps: Secondary | ICD-10-CM

## 2014-11-29 MED ORDER — AMBULATORY NON FORMULARY MEDICATION: Status: DC

## 2014-11-29 NOTE — Progress Notes (Signed)
Subjective:    Patient ID: Diane Ortiz, female    DOB: [birth year], 39 y.o.   MRN: [id number]  HPI Diane Ortiz is a 39 year old female with history of IBS, nonspecific GI motility disorder, adenomatous colon polyps is here for follow-up. She was managed by Dr. Sharlett Iles prior to his retirement and this is her first visit with me. She is here with HER-2 sons today. She was last seen in the office on 02/23/2013. She reports on the whole she is doing well. She continues domperidone 10 mg 2-4 times daily. She reports his medication definitely helps. She started a diet with Weight Watchers in order to lose weight. She is eating more fruits and vegetables and her constipation has resolved. She does wake up with early morning nausea if she eats a heavy or fatty meal for dinner. She also has learned taking her medications with food has worsened morning nausea and vomiting. She denies blood in her stool or melena. Denies dysphagia or odynophagia. She's proves he had endoscopy and colonoscopy performed by Dr. Sharlett Iles. Celiac evaluation was negative. She does take thyroid replacement hormone. She is on Zoloft for history of anxiety and depression. She also takes Cymbalta and buspirone.   Review of Systems  As per history of present illness, otherwise negative  Current Medications, Allergies, Past Medical History, Past Surgical History, Family History and Social History were reviewed in [employer] record.     Objective:   Physical Exam BP 118/84 mmHg  Pulse 80  Ht 5' (1.524 m)  Wt 147 lb 8 oz (66.906 kg)  BMI 28.81 kg/m2 Constitutional: Well-developed and well-nourished. No distress. HEENT: Normocephalic and atraumatic. Oropharynx is clear and moist. No oropharyngeal exudate. Conjunctivae are normal.  No scleral icterus. Neck: Neck supple. Trachea midline. Cardiovascular: Normal rate, regular rhythm and intact distal pulses. No M/R/G Pulmonary/chest: Effort normal and  breath sounds normal. No wheezing, rales or rhonchi. Abdominal: Soft, nontender, nondistended. Bowel sounds active throughout. There are no masses palpable. No hepatosplenomegaly. Extremities: no clubbing, cyanosis, or edema Lymphadenopathy: No cervical adenopathy noted. Neurological: Alert and oriented to person place and time. Skin: Skin is warm and dry. No rashes noted. Psychiatric: Normal mood and affect. Behavior is normal.  Colonoscopy 05/22/2011 -- 2 polyps removed from the sigmoid and rectum. Otherwise normal. Pathology revealed tubular adenoma. Upper endoscopy performed on 05/22/2011 showed 5 cm hiatal hernia and otherwise normal exam. Small bowel biopsies were normal     Assessment & Plan:   39 year old female with history of IBS, nonspecific GI motility disorder, adenomatous colon polyps is here for follow-up.  1. Nonspecific GI motility disorder/IBS/likely functional dyspepsia -- she is currently doing well. The Weight Watchers diet higher in fruits and vegetables and lower in saturated fats has benefited her overall IBS, constipation and nausea. She has benefited from domperidone and we will continue this 10 mg before meals and at bedtime. I have informed her that she needs EKG annually to exclude prolonged QT. She has primary care visit coming up with Marcene Corning, PA-C and she will have EKG performed there and faxed to [place]. Continue AcipHex 20 mg daily. Continue high-fiber diet working well for her constipation  2. History of adenomatous colon polyp -- repeat colonoscopy commended May 2018. She is aware of this recommendation  25 minutes spent with the patient today reviewing records and discussing her medical issues, greater than 50% was spent in counseling and coordination of care with the patient

## 2014-11-29 NOTE — Patient Instructions (Addendum)
Please follow up with Dr Hilarie Fredrickson in 1 year.  We have written presciption for domperidone to take before meals and at bedtime.   Please have an EKG completed by your primary care physician at your visit with them 03/14/15 and send results to attn: [name] @ [phone].

## 2015-01-24 ENCOUNTER — Ambulatory Visit (INDEPENDENT_AMBULATORY_CARE_PROVIDER_SITE_OTHER): Payer: BLUE CROSS/BLUE SHIELD | Admitting: Internal Medicine

## 2015-01-24 ENCOUNTER — Encounter: Payer: Self-pay | Admitting: Internal Medicine

## 2015-01-24 ENCOUNTER — Telehealth: Payer: Self-pay | Admitting: Internal Medicine

## 2015-01-24 VITALS — BP 114/78 | HR 91 | Ht 60.0 in | Wt 144.0 lb

## 2015-01-24 DIAGNOSIS — J3089 Other allergic rhinitis: Principal | ICD-10-CM

## 2015-01-24 DIAGNOSIS — J45998 Other asthma: Secondary | ICD-10-CM | POA: Diagnosis not present

## 2015-01-24 DIAGNOSIS — J309 Allergic rhinitis, unspecified: Secondary | ICD-10-CM | POA: Diagnosis not present

## 2015-01-24 DIAGNOSIS — J302 Other seasonal allergic rhinitis: Secondary | ICD-10-CM

## 2015-01-24 MED ORDER — LEVOCETIRIZINE DIHYDROCHLORIDE 5 MG PO TABS: 5.0000 mg | ORAL_TABLET | Freq: Every day | ORAL | Status: DC | PRN

## 2015-01-24 MED ORDER — AEROCHAMBER MV MISC: Status: DC

## 2015-01-24 MED ORDER — MOMETASONE FURO-FORMOTEROL FUM 100-5 MCG/ACT IN AERO: 2.0000 | INHALATION_SPRAY | Freq: Two times a day (BID) | RESPIRATORY_TRACT | Status: DC

## 2015-01-24 MED ORDER — EPINEPHRINE 0.3 MG/0.3ML IJ SOAJ: INTRAMUSCULAR | Status: DC

## 2015-01-24 MED ORDER — MOMETASONE FURO-FORMOTEROL FUM 200-5 MCG/ACT IN AERO: INHALATION_SPRAY | RESPIRATORY_TRACT | Status: DC

## 2015-01-24 MED ORDER — ALBUTEROL SULFATE HFA 108 (90 BASE) MCG/ACT IN AERS: 2.0000 | INHALATION_SPRAY | Freq: Four times a day (QID) | RESPIRATORY_TRACT | Status: DC | PRN

## 2015-01-24 NOTE — Patient Instructions (Signed)
We can continue allergy vaccicne 1:10 GO for another year  Script refilling Epipen  Script refilling albuterol rescue inhaler  Script for spacer tube  Script and sample to try [facility] maintenance controller (similar to Symbicort)   Try using this through aerochamber spacer tube     Inhale 2 puffs then rinse mouth , twice daily  Script for xyzal

## 2015-01-24 NOTE — Progress Notes (Signed)
11/13/10- 40 year old female never smoker, history chronic asthma, allergic rhinitis, food allergy complicated by thrombocytosis, polycystic kidney disease, GERD Last here 06/03/2009 Has had flu vaccine. Continues allergy vaccine. History of symptoms flare when she was taken off of allergy vaccine at the [facility], before restarting here. She is having little drainage now. Currently on Levaquin for pyelonephritis. Being followed elsewhere for abnormal liver function and white blood count over the past year. Taken off of blood pressure medicines and her pressure stays down. Mentions being tired in the daytime. Feels that she is sleeping well at night and denies snoring. Admits this might be part of her depression. I recommended she discuss it with her psychiatrist.  09/18/2011 Acute OV  Complains of increased SOB, wheezing, tightness in chest x2 months, worse since yesterday.  has been to UC  x2 w/ pred taper rx.  Does not like to take steroids , so did not take.  Has been off QVAR for > 1year. Says over last 3-4 months more  Asthma flares with cough and wheezing.  Seen yesterday with URI at UC , rx abx ? Name No records available.  Says her son has been sick    10/01/11- 40 year old female never smoker, history chronic asthma, allergic rhinitis, food allergy,  complicated by thrombocytosis, polycystic kidney disease, GERD Stopped Qvar-extreme throat and thrush; unable to sleep at night. Needs another inhaler. Singulair no help. Followed for leukocytosis by hematologist. Had seen infectious disease specialist with past workup negative. Increased seasonal rhinitis symptoms  Allergy vaccine 1:50  injections were being given by her mother and she says they were helpful. However I pointed out that she last ordered vaccine a year ago and therefore wasn't really using it. I told her I was going to stop it she became quite upset. FENO assay 21  06/01/13- 40 year old female never  smoker, history chronic asthma, allergic rhinitis, food allergy,  complicated by thrombocytosis, polycystic kidney disease, GERD FOLLOWS FOR:  Still on Allergy vaccine 1:50 GO and doing well.   She stopped allergy shots for a while in October of 2014 because she believed they were making her sleepy. She restarted, but needed to reduce volume to 0.1 mL profile per week during peak pollen season to avoid local reactions. Uses rescue inhaler before running. Xyzal every night, Benadryl if needed.  01/24/15-40 year old female never smoker, history chronic asthma, allergic rhinitis, food allergy,  complicated by thrombocytosis, polycystic kidney disease, GERD FOLLOWS FOR: still on allergy vaccine and doing well; will need refill for allergy serum as her current vials expired 2-3 weeks ago. Allergy vaccine 1:10 GO Son is on allergy vaccine now. She uses Dymista keeps an EpiPen  ROS-see HPI Constitutional:   No-   weight loss, night sweats, fevers, chills, +fatigue, lassitude. HEENT:   No-  headaches, difficulty swallowing, tooth/dental problems, sore throat,       + sneezing,+ itching, ear ache,  +Little-nasal congestion, post nasal drip,  CV:  No-   chest pain, orthopnea, PND, swelling in lower extremities, anasarca, dizziness, palpitations Resp: No-   shortness of breath with exertion or at rest.              No-   productive cough,  No non-productive cough,  No- coughing up of blood.              No-   change in color of mucus.  + Occasional wheezing.   Skin: No-   rash or lesions. GI:  No-  heartburn, indigestion, abdominal pain, nausea, vomiting,  GU:  MS:  No-   joint pain or swelling.   Neuro-     nothing unusual Psych:  No- change in mood or affect. No depression or anxiety.  No memory loss.  OBJ General- Alert, Oriented, Affect-appropriate, Distress- none acute; weight Skin- rash-none, lesions- none, excoriation- none Lymphadenopathy- none Head- atraumatic            Eyes- Gross  vision intact, PERRLA, conjunctivae clear secretions            Ears- Hearing, canals-normal            Nose- Clear, no-Septal dev, mucus, polyps, erosion, perforation             Throat- Mallampati II ,m ucosa clear , drainage- none, tonsils- atrophic Neck- flexible , trachea midline, no stridor , thyroid nl, carotid no bruit Chest - symmetrical excursion , unlabored           Heart/CV- RRR , no murmur , no gallop  , no rub, nl s1 s2                           - JVD- none , edema- none, stasis changes- none, varices- none           Lung- clear to P&A, wheeze- none, cough+ dry , dullness-none, rub- none           Chest wall-  Abd-  Br/ Gen/ Rectal- Not done, not indicated Extrem- cyanosis- none, clubbing, none, atrophy- none, strength- nl Neuro- grossly intact to observation

## 2015-01-24 NOTE — Telephone Encounter (Signed)
Allergy Serum Extract Date Mixed: 01/24/15 Vial: 1 Strength: 1:10 Here/Mail/Pick Up: mail Mixed By: tbs Last OV: 01/24/15 Pending OV: n/a

## 2015-01-25 DIAGNOSIS — J309 Allergic rhinitis, unspecified: Secondary | ICD-10-CM

## 2015-02-03 NOTE — Assessment & Plan Note (Signed)
Mild intermittent uncomplicated well-controlled Plan-Spacer tube< maintenance dulera

## 2015-02-03 NOTE — Assessment & Plan Note (Signed)
Satisfactory control with allergy vaccine and seasonal supplements Plan-refillXyzal antihistamine

## 2015-03-18 ENCOUNTER — Telehealth: Payer: Self-pay | Admitting: *Deleted

## 2015-03-18 NOTE — Telephone Encounter (Signed)
Patient brought a copy of her recent EKG from 03/14/15 to the office (she is on domperidone). Per Dr Hilarie Fredrickson, QTc  Is within normal limits. Repeat EKG in 1 year and remain on domperidone for now. See actual EKG report under "media" tab in EPIC.

## 2015-09-18 ENCOUNTER — Telehealth: Payer: Self-pay | Admitting: Internal Medicine

## 2015-09-18 NOTE — Telephone Encounter (Signed)
Called pt. To confirm address. She is aware it will be tomorrow before I get this in the mail. Nothing further needed.

## 2015-09-19 NOTE — Telephone Encounter (Signed)
[name] can we sign off of this message?  thanks

## 2016-01-10 ENCOUNTER — Emergency Department (HOSPITAL_COMMUNITY): Payer: PRIVATE HEALTH INSURANCE

## 2016-01-10 ENCOUNTER — Emergency Department (HOSPITAL_COMMUNITY)
Admission: EM | Admit: 2016-01-10 | Discharge: 2016-01-10 | Disposition: A | Discharge status: Home / Self Care | Payer: PRIVATE HEALTH INSURANCE | Attending: Emergency Medicine | Admitting: Emergency Medicine

## 2016-01-10 ENCOUNTER — Encounter (HOSPITAL_COMMUNITY): Payer: Self-pay | Admitting: Radiology

## 2016-01-10 DIAGNOSIS — Z91040 Latex allergy status: Secondary | ICD-10-CM | POA: Insufficient documentation

## 2016-01-10 DIAGNOSIS — I10 Essential (primary) hypertension: Secondary | ICD-10-CM | POA: Diagnosis not present

## 2016-01-10 DIAGNOSIS — R202 Paresthesia of skin: Secondary | ICD-10-CM

## 2016-01-10 DIAGNOSIS — Z7984 Long term (current) use of oral hypoglycemic drugs: Secondary | ICD-10-CM | POA: Insufficient documentation

## 2016-01-10 DIAGNOSIS — R079 Chest pain, unspecified: Secondary | ICD-10-CM | POA: Diagnosis not present

## 2016-01-10 DIAGNOSIS — R42 Dizziness and giddiness: Secondary | ICD-10-CM | POA: Insufficient documentation

## 2016-01-10 DIAGNOSIS — Z79899 Other long term (current) drug therapy: Secondary | ICD-10-CM | POA: Insufficient documentation

## 2016-01-10 DIAGNOSIS — J45909 Unspecified asthma, uncomplicated: Secondary | ICD-10-CM | POA: Insufficient documentation

## 2016-01-10 HISTORY — DX: Unspecified asthma, uncomplicated: J45.909

## 2016-01-10 LAB — CBC
HEMATOCRIT: 39.4 % (ref 36.0–46.0)
Hemoglobin: 12.7 g/dL (ref 12.0–15.0)
MCH: 24.8 pg — ABNORMAL LOW (ref 26.0–34.0)
MCHC: 32.2 g/dL (ref 30.0–36.0)
MCV: 77 fL — AB (ref 78.0–100.0)
Platelets: 474 10*3/uL — ABNORMAL HIGH (ref 150–400)
RBC: 5.12 MIL/uL — AB (ref 3.87–5.11)
RDW: 13.7 % (ref 11.5–15.5)
WBC: 15.9 10*3/uL — ABNORMAL HIGH (ref 4.0–10.5)

## 2016-01-10 LAB — BASIC METABOLIC PANEL
ANION GAP: 11 (ref 5–15)
BUN: 10 mg/dL (ref 6–20)
CHLORIDE: 102 mmol/L (ref 101–111)
CO2: 24 mmol/L (ref 22–32)
Calcium: 9.1 mg/dL (ref 8.9–10.3)
Creatinine, Ser: 0.65 mg/dL (ref 0.44–1.00)
GFR calc non Af Amer: >60 mL/min (ref >60)
Glucose, Bld: 89 mg/dL (ref 65–99)
POTASSIUM: 3.5 mmol/L (ref 3.5–5.1)
SODIUM: 137 mmol/L (ref 135–145)

## 2016-01-10 LAB — TROPONIN I: Troponin I: <0.03 ng/mL (ref <0.03)

## 2016-01-10 LAB — D-DIMER, QUANTITATIVE: D-Dimer, Quant: <0.27 ug/mL-FEU (ref 0.00–0.50)

## 2016-01-10 LAB — HCG, QUANTITATIVE, PREGNANCY

## 2016-01-10 NOTE — Discharge Instructions (Signed)
Take over-the-counter pain medications as needed. Follow-up with your doctor next week to be rechecked. Return to emergency room as needed for any worsening symptoms or other concerns

## 2016-01-10 NOTE — ED Triage Notes (Signed)
Pt now reports SOB and increasing CP.

## 2016-01-10 NOTE — ED Notes (Addendum)
Patient approached this writer in tears, stating she is "worried about what's wrong with me" Informed patient of delay. Informed patient that Md looks at EKG and lab work and if there is anything emergent, we address that immediately. Encouraged patient to let this writer know if they start to feel worse or is anything changes in condition. Patient is now calm and in no distress.

## 2016-01-10 NOTE — ED Triage Notes (Signed)
Pt reports L side numbness that started yesterday am. Pt also reports aphasia yesterday, but none today. Went to [facility] yesterday for same, but left AMA after labs drawn. Otherwise has continued intermittent lightheadedness and CP.

## 2016-01-10 NOTE — ED Provider Notes (Signed)
[facility] DEPT Provider Note   CSN: [id number] Arrival date & time: 01/10/16  1048     History   Chief Complaint Chief Complaint  Patient presents with  . extremity numbness onset yesterday am    HPI Diane Ortiz is a 40 y.o. female.  HPI Pt started having lightheadedness yesterday.  She then noticed that the left side of her body was feeling numb.  Not completely numb but different than the other side.  It was harder to speak yesterday.   Her memory felt off. She also has been having some sharp pain in her chest, shortness of breath and nausea.  The pain in the chest is intermittent and waxes and wanes.  It increases with deep breathing.  No heart disease.  No PE.   Past Medical History:  Diagnosis Date  . Asthma    pro air PRN  . Chronic interstitial cystitis   . Constipation   . Contact dermatitis and other eczema, due to unspecified cause   . Depressive disorder, not elsewhere classified   . Dysthymic disorder   . Elevated C-reactive protein (CRP)   . Esophageal reflux   . Essential thrombocythemia ([place])   . Family history of colon cancer   . Fatty liver   . Generalized anxiety disorder   . Hepatic cyst   . Hepatitis, unspecified   . Hiatal hernia   . Hyperlipemia   . Hypertension   . Hypotension, unspecified   . Incomplete bladder emptying   . Irregular menstrual cycle   . Irritable bowel syndrome   . Migraine, unspecified, without mention of intractable migraine without mention of status migrainosus   . Muscle weakness (generalized)   . Myalgia and myositis, unspecified   . Overweight(278.02)   . Pain in joint, multiple sites   . Personal history of unspecified circulatory disease   . Polycystic kidney, unspecified type   . Polycystic ovaries    metformin  . Restless legs syndrome (RLS)   . Rhabdomyolysis   . Sicca syndrome ([place])   . Tubular adenoma of colon   . Unspecified asthma     Patient Active Problem List   Diagnosis Date Noted    . Thrush 10/10/2011  . Seasonal and perennial allergic rhinitis 06/03/2009  . ALLERGY, FOOD 06/03/2009  . HYPERLIPIDEMIA 04/26/2009  . THROMBOCYTOSIS 04/26/2009  . ANXIETY DEPRESSION 04/26/2009  . HYPERTENSION 04/26/2009  . Allergic-infective asthma 04/26/2009  . ACID REFLUX DISEASE 04/26/2009  . FATTY LIVER DISEASE 04/26/2009  . POLYCYSTIC KIDNEY DISEASE 04/26/2009  . HEART MURMUR, HX OF 04/26/2009    Past Surgical History:  Procedure Laterality Date  . APPENDECTOMY  1980's  . CESAREAN SECTION     x2  . TONSILLECTOMY  1980's  . TUBAL LIGATION  2010    OB History    No data available       Home Medications    Prior to Admission medications   Medication Sig Start Date End Date Taking? Authorizing Provider  acetaminophen (TYLENOL) 325 MG tablet Take 650 mg by mouth as needed.   Yes Historical Provider, MD  albuterol (PROVENTIL HFA;VENTOLIN HFA) 108 (90 Base) MCG/ACT inhaler Inhale 2 puffs into the lungs every 6 (six) hours as needed for wheezing or shortness of breath. 01/24/15  Yes Deneise Lever, MD  Amphet-Dextroamphet 3-Bead ER (MYDAYIS) 25 MG CP24 Take 25 mg by mouth daily. 01/05/16  Yes Historical Provider, MD  Azelastine-Fluticasone (DYMISTA) 137-50 MCG/ACT SUSP Place 1 spray into the nose daily. Patient taking  differently: Place 1 spray into the nose daily as needed (allergies).  06/01/13  Yes Deneise Lever, MD  busPIRone (BUSPAR) 15 MG tablet Take 15 mg by mouth at bedtime.  09/25/10  Yes Historical Provider, MD  butalbital-acetaminophen-caffeine (FIORICET, ESGIC) 50-325-40 MG per tablet Take 1 tablet by mouth every 6 (six) hours as needed for headache or migraine. Take as needed for migraines 05/16/11  Yes Historical Provider, MD  CYMBALTA 60 MG capsule Take 60 mg by mouth daily.  11/10/10  Yes Historical Provider, MD  EPINEPHrine 0.3 mg/0.3 mL IJ SOAJ injection Inject into thigh for severe allergic reaction Patient taking differently: Inject 0.3 mg into the skin once  as needed (severe allergic reaction). Inject into thigh for severe allergic reaction 01/24/15  Yes Deneise Lever, MD  hydrochlorothiazide (HYDRODIURIL) 25 MG tablet Take 1 tablet by mouth daily. 10/16/10  Yes Historical Provider, MD  levocetirizine (XYZAL) 5 MG tablet Take 1 tablet (5 mg total) by mouth daily as needed. Patient taking differently: Take 5 mg by mouth every evening.  01/24/15  Yes Deneise Lever, MD  losartan (COZAAR) 25 MG tablet Take 25 mg by mouth at bedtime.  09/05/14 01/10/16 Yes Historical Provider, MD  metFORMIN (GLUCOPHAGE) 500 MG tablet Take 500 mg by mouth 2 (two) times daily with a meal.   Yes Historical Provider, MD  NATURE-THROID 97.5 MG TABS Take 97.5 mg by mouth daily.  01/08/15  Yes Historical Provider, MD  NONFORMULARY OR COMPOUNDED ITEM Inject 1 Dose into the skin once a week. Allergy Vaccine 1:10 Given at Home    Yes Historical Provider, MD  PRESCRIPTION MEDICATION Take 50 mg by mouth at bedtime. Progesterone 50mg  compounded capsule take for 3 weeks then stop while on cycle   Yes Historical Provider, MD  PRESCRIPTION MEDICATION Take 10 mg by mouth 3 (three) times daily as needed (digestion). Domperidone 10mg  tablets pt gets from [place], to take with meals TID but pt hasn't been taking as prescribed recently   Yes Historical Provider, MD  PRESCRIPTION MEDICATION Apply 1 application topically daily. Testosterone cream   Yes Historical Provider, MD  PRESCRIPTION MEDICATION Take 1 capsule by mouth daily. Vitamin [id number] + Folic acid   Yes Historical Provider, MD  Probiotic Product (PROBIOTIC DAILY PO) Take 1 tablet by mouth daily.   Yes Historical Provider, MD  RABEprazole (ACIPHEX) 20 MG tablet Take 20 mg by mouth at bedtime.  09/05/14  Yes Historical Provider, MD  sertraline (ZOLOFT) 50 MG tablet Take 75 mg by mouth daily.    Yes Historical Provider, MD  spironolactone (ALDACTONE) 25 MG tablet Take 25 mg by mouth 2 (two) times daily.   Yes Historical Provider, MD  topiramate  (TOPAMAX) 25 MG capsule Take 12.5 mg by mouth at bedtime. 1/2 tab by mouth once daily   Yes Historical Provider, MD  AMBULATORY NON FORMULARY MEDICATION Medication Name: domperidone 10 mg before meals and at bedtime 11/29/14   Jerene Bears, MD  metFORMIN (GLUCOPHAGE-XR) 500 MG 24 hr tablet Take 500 mg by mouth 2 (two) times daily. 01/07/16   Historical Provider, MD  mometasone-formoterol (DULERA) 100-5 MCG/ACT AERO Inhale 2 puffs into the lungs 2 (two) times daily. Patient not taking: Reported on 01/10/2016 01/24/15   Deneise Lever, MD  mometasone-formoterol [facility]) 200-5 MCG/ACT AERO Inhale 2 puffs then rinse mouth, twice daily Patient not taking: Reported on 01/10/2016 01/24/15   Deneise Lever, MD  Spacer/Aero-Holding Chambers (AEROCHAMBER MV) inhaler Use as instructed 01/24/15  Deneise Lever, MD    Family History Family History  Problem Relation Age of Onset  . Allergies Mother   . Asthma Mother   . Heart disease Father   . Heart disease Maternal Grandfather   . Colon cancer Paternal Grandmother   . Kidney cancer      aunt  . Breast cancer    . Bone cancer      Social History Social History  Substance Use Topics  . Smoking status: Never Smoker  . Smokeless tobacco: Never Used  . Alcohol use No     Allergies   Aspirin; Ivp dye [iodinated diagnostic agents]; Penicillins; Qvar [beclomethasone]; Shellfish allergy; Strawberry extract; Cephalexin; Ibuprofen; Metrizamide; and Latex   Review of Systems Review of Systems   Physical Exam Updated Vital Signs BP 132/80 (BP Location: Left Arm)   Pulse 98   Temp 98.8 F (37.1 C) (Oral)   Resp 16   Ht 5' (1.524 m)   Wt 64.9 kg   LMP 01/09/2016   SpO2 98%   BMI 27.93 kg/m   Physical Exam  Constitutional: She appears well-developed and well-nourished. No distress.  HENT:  Head: Normocephalic and atraumatic.  Right Ear: External ear normal.  Left Ear: External ear normal.  Eyes: Conjunctivae are normal. Right eye  exhibits no discharge. Left eye exhibits no discharge. No scleral icterus.  Neck: Neck supple. No tracheal deviation present.  Cardiovascular: Normal rate, regular rhythm and intact distal pulses.   Pulmonary/Chest: Effort normal and breath sounds normal. No stridor. No respiratory distress. She has no wheezes. She has no rales.  Abdominal: Soft. Bowel sounds are normal. She exhibits no distension. There is no tenderness. There is no rebound and no guarding.  Musculoskeletal: She exhibits no edema or tenderness.  Neurological: She is alert. She has normal strength. No cranial nerve deficit (no facial droop, extraocular movements intact, no slurred speech) or sensory deficit. She exhibits normal muscle tone. She displays no seizure activity. Coordination normal.  Skin: Skin is warm and dry. No rash noted.  Psychiatric: She has a normal mood and affect.  Nursing note and vitals reviewed.    ED Treatments / Results  Labs (all labs ordered are listed, but only abnormal results are displayed) Labs Reviewed  CBC - Abnormal; Notable for the following:       Result Value   WBC 15.9 (*)    RBC 5.12 (*)    MCV 77.0 (*)    MCH 24.8 (*)    Platelets 474 (*)    All other components within normal limits  BASIC METABOLIC PANEL  D-DIMER, QUANTITATIVE (NOT AT [facility])  TROPONIN I  HCG, QUANTITATIVE, PREGNANCY    EKG  EKG Interpretation  Date/Time:  Friday January 10 2016 11:05:52 EST Ventricular Rate:  102 PR Interval:    QRS Duration: 78 QT Interval:  330 QTC Calculation: 430 R Axis:   65 Text Interpretation:  Sinus tachycardia No old tracing to compare Confirmed by Yaritsa Savarino  MD-J, Sya Nestler [phone]) on 01/10/2016 1:16:19 PM       Radiology Dg Chest 2 View  Result Date: 01/10/2016 CLINICAL DATA:  Left-sided sharp chest pain since yesterday. One week of shortness of breath. History of asthma -bronchitis, gastroesophageal reflux, nonsmoker. EXAM: CHEST  2 VIEW COMPARISON:  PA and lateral chest  x-ray of April 26, 2009. FINDINGS: The lungs are adequately inflated. There is no focal infiltrate. There is no pneumothorax, pneumomediastinum, or pleural effusion. The heart and pulmonary vascularity are normal.  The mediastinum is normal in width. The bony thorax exhibits no acute abnormality. IMPRESSION: There is no active cardiopulmonary disease. Electronically Signed   By: David  Martinique M.D.   On: 01/10/2016 12:54   Ct Head Wo Contrast  Result Date: 01/10/2016 CLINICAL DATA:  Frontal headache with dizziness. Left-sided weakness. Expressive aphasia EXAM: CT HEAD WITHOUT CONTRAST TECHNIQUE: Contiguous axial images were obtained from the base of the skull through the vertex without intravenous contrast. COMPARISON:  None. FINDINGS: Brain: The ventricles are normal in size and configuration. There is a cavum septum pellucidum, an anatomic variant. There is no intracranial mass, hemorrhage, extra-axial fluid collection, or midline shift. Gray-white compartments appear normal. No acute infarct evident. Vascular: There is no hyperdense vessel. No vascular calcifications are evident. Skull: The bony calvarium appears intact. Sinuses/Orbits: There is opacification in an anterior left-sided ethmoid air cell. Other visualized paranasal sinuses clear. Orbits appear symmetric bilaterally. Other: Mastoid air cells clear. IMPRESSION: Opacification of a single ethmoid air cell on the left anteriorly. No intracranial mass hemorrhage, extra-axial fluid collection. Gray-white compartments appear normal. Electronically Signed   By: Lowella Grip III M.D.   On: 01/10/2016 14:54    Procedures Procedures (including critical care time)  Medications Ordered in ED Medications - No data to display   Initial Impression / Assessment and Plan / ED Course  I have reviewed the triage vital signs and the nursing notes.  Pertinent labs & imaging results that were available during my care of the patient were reviewed by me  and considered in my medical decision making (see chart for details).  Clinical Course as of Jan 09 1538  Fri Jan 10, 2016  [place] Labs reviewed.  Elevated WBC and platelet count.  Doubt clinically significant  [JK]    Clinical Course User Index [JK] Dorie Rank, MD  Pt presented with complaints of dizziness, numbness, chest pain.  Exam is reassuring.  No murmur noted.  Normal pulses.  No focal neurologic deficits on exam.  Doubt TIA, stroke.  Doubt PE, ACS, dissection.  ?atypical migraine, ?related to anxiety, ?viral illness with mild vertigo   At this time there does not appear to be any evidence of an acute emergency medical condition and the patient appears stable for discharge with appropriate outpatient follow up.  Discussed follow up with PCP and reasons to return to the ED    Final Clinical Impressions(s) / ED Diagnoses   Final diagnoses:  Paresthesia  Dizziness  Chest pain, unspecified type    New Prescriptions New Prescriptions   No medications on file     Dorie Rank, MD 01/10/16 1539

## 2016-02-18 ENCOUNTER — Telehealth: Payer: Self-pay | Admitting: *Deleted

## 2016-02-18 NOTE — Telephone Encounter (Signed)
===  View-only below this line===  ----- Message ----- From: Jerene Bears, MD Sent: 02/18/2016  10:19 AM To: Larina Bras, CMA  EKG normal 01/10/2016 Can continue domperidone and repeat EKG in 1 year Due surveillance colon in May 2018 JMP   Colon recall already in [facility] for 2018. Repeat EKG reminder has been sent to myself for December 2018.

## 2016-03-31 ENCOUNTER — Encounter: Payer: Self-pay | Admitting: *Deleted

## 2016-04-16 ENCOUNTER — Encounter: Payer: Self-pay | Admitting: Internal Medicine

## 2016-06-04 ENCOUNTER — Ambulatory Visit (INDEPENDENT_AMBULATORY_CARE_PROVIDER_SITE_OTHER): Payer: BLUE CROSS/BLUE SHIELD | Admitting: Allergy and Immunology

## 2016-06-04 ENCOUNTER — Encounter: Payer: Self-pay | Admitting: Allergy and Immunology

## 2016-06-04 VITALS — BP 110/80 | HR 96 | Temp 98.8°F | Resp 16 | Ht 59.84 in | Wt 148.4 lb

## 2016-06-04 DIAGNOSIS — J4520 Mild intermittent asthma, uncomplicated: Secondary | ICD-10-CM

## 2016-06-04 DIAGNOSIS — T7800XD Anaphylactic reaction due to unspecified food, subsequent encounter: Secondary | ICD-10-CM

## 2016-06-04 DIAGNOSIS — H1013 Acute atopic conjunctivitis, bilateral: Secondary | ICD-10-CM

## 2016-06-04 DIAGNOSIS — J45901 Unspecified asthma with (acute) exacerbation: Secondary | ICD-10-CM | POA: Insufficient documentation

## 2016-06-04 DIAGNOSIS — H1010 Acute atopic conjunctivitis, unspecified eye: Secondary | ICD-10-CM | POA: Insufficient documentation

## 2016-06-04 DIAGNOSIS — J3089 Other allergic rhinitis: Secondary | ICD-10-CM

## 2016-06-04 DIAGNOSIS — J302 Other seasonal allergic rhinitis: Secondary | ICD-10-CM

## 2016-06-04 MED ORDER — LEVOCETIRIZINE DIHYDROCHLORIDE 5 MG PO TABS: 5.0000 mg | ORAL_TABLET | Freq: Every evening | ORAL | 5 refills | Status: DC

## 2016-06-04 MED ORDER — OLOPATADINE HCL 0.2 % OP SOLN: 1.0000 [drp] | OPHTHALMIC | 5 refills | Status: DC

## 2016-06-04 MED ORDER — EPINEPHRINE 0.3 MG/0.3ML IJ SOAJ: INTRAMUSCULAR | 3 refills | Status: DC

## 2016-06-04 MED ORDER — AZELASTINE HCL 0.15 % NA SOLN: 1.0000 | Freq: Two times a day (BID) | NASAL | 3 refills | Status: DC | PRN

## 2016-06-04 NOTE — Assessment & Plan Note (Signed)
   Aeroallergen avoidance measures have been discussed and provided in written form.  For now, continue levocetirizine 5 mg daily as needed and Dymista, 2 sprays per nostril 1-2 times daily as needed.  I have also recommended nasal saline spray (i.e., Simply Saline) or nasal saline lavage (i.e., NeilMed) as needed and prior to medicated nasal sprays.  For thick post nasal drainage, add guaifenesin 1200 mg (Mucinex Maximum Strength)  twice daily as needed with adequate hydration as discussed.  The risks and benefits of aeroallergen immunotherapy have been discussed. The patient is motivated to resume/restart immunotherapy to reduce symptoms and decrease medication requirement. Informed consent has been signed and allergen vaccine orders have been submitted. Medications will be decreased or discontinued as symptom relief from immunotherapy becomes evident.

## 2016-06-04 NOTE — Assessment & Plan Note (Addendum)
   Meticulous avoidance of tree nuts, strawberries, and shellfish as discussed.  A prescription has been provided for epinephrine auto-injector 2 pack along with instructions for proper administration.  A food allergy action plan has been provided and discussed.  Medic Alert identification is recommended.

## 2016-06-04 NOTE — Assessment & Plan Note (Signed)
   Treatment plan as outlined above for allergic rhinitis.  A prescription has been provided for Pazeo, one drop per eye daily as needed.  I have also recommended eye lubricant drops (i.e., Natural Tears) as needed. 

## 2016-06-04 NOTE — Assessment & Plan Note (Signed)
   Continue albuterol HFA, 1-2 inhalations every 4-6 hours as needed and 15 minutes prior to vigorous exercise.  Subjective and objective measures of pulmonary function will be followed and the treatment plan will be adjusted accordingly.

## 2016-06-04 NOTE — Patient Instructions (Addendum)
Seasonal and perennial allergic rhinitis  Aeroallergen avoidance measures have been discussed and provided in written form.  For now, continue levocetirizine 5 mg daily as needed and Dymista, 2 sprays per nostril 1-2 times daily as needed.  I have also recommended nasal saline spray (i.e., Simply Saline) or nasal saline lavage (i.e., NeilMed) as needed and prior to medicated nasal sprays.  For thick post nasal drainage, add guaifenesin 1200 mg (Mucinex Maximum Strength)  twice daily as needed with adequate hydration as discussed.  The risks and benefits of aeroallergen immunotherapy have been discussed. The patient is motivated to resume/restart immunotherapy to reduce symptoms and decrease medication requirement. Informed consent has been signed and allergen vaccine orders have been submitted. Medications will be decreased or discontinued as symptom relief from immunotherapy becomes evident.  Allergic conjunctivitis  Treatment plan as outlined above for allergic rhinitis.  A prescription has been provided for Pazeo, one drop per eye daily as needed.  I have also recommended eye lubricant drops (i.e., Natural Tears) as needed.  Mild intermittent asthma  Continue albuterol HFA, 1-2 inhalations every 4-6 hours as needed and 15 minutes prior to vigorous exercise.  Subjective and objective measures of pulmonary function will be followed and the treatment plan will be adjusted accordingly.  Food allergy  Meticulous avoidance of tree nuts, strawberries, and shellfish as discussed.  A prescription has been provided for epinephrine auto-injector 2 pack along with instructions for proper administration.  A food allergy action plan has been provided and discussed.  Medic Alert identification is recommended.   Return in about 3 months (around 09/04/2016), or if symptoms worsen or fail to improve.  Reducing Pollen Exposure  The American Academy of Allergy, Asthma and Immunology suggests  the following steps to reduce your exposure to pollen during allergy seasons.    1. Do not hang sheets or clothing out to dry; pollen may collect on these items. 2. Do not mow lawns or spend time around freshly cut grass; mowing stirs up pollen. 3. Keep windows closed at night.  Keep car windows closed while driving. 4. Minimize morning activities outdoors, a time when pollen counts are usually at their highest. 5. Stay indoors as much as possible when pollen counts or humidity is high and on windy days when pollen tends to remain in the air longer. 6. Use air conditioning when possible.  Many air conditioners have filters that trap the pollen spores. 7. Use a HEPA room air filter to remove pollen form the indoor air you breathe.   Control of House Dust Mite Allergen  House dust mites play a major role in allergic asthma and rhinitis.  They occur in environments with high humidity wherever human skin, the food for dust mites is found. High levels have been detected in dust obtained from mattresses, pillows, carpets, upholstered furniture, bed covers, clothes and soft toys.  The principal allergen of the house dust mite is found in its feces.  A gram of dust may contain 1,000 mites and 250,000 fecal particles.  Mite antigen is easily measured in the air during house cleaning activities.    1. Encase mattresses, including the box spring, and pillow, in an air tight cover.  Seal the zipper end of the encased mattresses with wide adhesive tape. 2. Wash the bedding in water of 130 degrees Farenheit weekly.  Avoid cotton comforters/quilts and flannel bedding: the most ideal bed covering is the dacron comforter. 3. Remove all upholstered furniture from the bedroom. 4. Remove carpets, carpet  padding, rugs, and non-washable window drapes from the bedroom.  Wash drapes weekly or use plastic window coverings. 5. Remove all non-washable stuffed toys from the bedroom.  Wash stuffed toys weekly. 6. Have the  room cleaned frequently with a vacuum cleaner and a damp dust-mop.  The patient should not be in a room which is being cleaned and should wait 1 hour after cleaning before going into the room. 7. Close and seal all heating outlets in the bedroom.  Otherwise, the room will become filled with dust-laden air.  An electric heater can be used to heat the room. Reduce indoor humidity to less than 50%.  Do not use a humidifier.  Control of Dog or Cat Allergen  Avoidance is the best way to manage a dog or cat allergy. If you have a dog or cat and are allergic to dog or cats, consider removing the dog or cat from the home. If you have a dog or cat but don't want to find it a new home, or if your family wants a pet even though someone in the household is allergic, here are some strategies that may help keep symptoms at bay:  1. Keep the pet out of your bedroom and restrict it to only a few rooms. Be advised that keeping the dog or cat in only one room will not limit the allergens to that room. 2. Don't pet, hug or kiss the dog or cat; if you do, wash your hands with soap and water. 3. High-efficiency particulate air (HEPA) cleaners run continuously in a bedroom or living room can reduce allergen levels over time. 4. Regular use of a high-efficiency vacuum cleaner or a central vacuum can reduce allergen levels. 5. Giving your dog or cat a bath at least once a week can reduce airborne allergen.  Control of Mold Allergen  Mold and fungi can grow on a variety of surfaces provided certain temperature and moisture conditions exist.  Outdoor molds grow on plants, decaying vegetation and soil.  The major outdoor mold, Alternaria and Cladosporium, are found in very high numbers during hot and dry conditions.  Generally, a late Summer - Fall peak is seen for common outdoor fungal spores.  Rain will temporarily lower outdoor mold spore count, but counts rise rapidly when the rainy period ends.  The most important indoor  molds are Aspergillus and Penicillium.  Dark, humid and poorly ventilated basements are ideal sites for mold growth.  The next most common sites of mold growth are the bathroom and the kitchen.  Outdoor [employer] 1. Use air conditioning and keep windows closed 2. Avoid exposure to decaying vegetation. 3. Avoid leaf raking. 4. Avoid grain handling. 5. Consider wearing a face mask if working in moldy areas.  Indoor Mold Control 1. Maintain humidity below 50%. 2. Clean washable surfaces with 5% bleach solution. 3. Remove sources e.g. Contaminated carpets.

## 2016-06-04 NOTE — Progress Notes (Signed)
New Patient Note  RE: Diane Ortiz MRN: [id number] DOB: 08-Nov-1975 Date of Office Visit: 06/04/2016  Referring provider: Manfred Shirts, PA Primary care provider: Manfred Shirts, PA  Chief Complaint: Allergic Rhinitis  and Asthma   History of present illness: Diane Ortiz is a 41 y.o. female with allergic rhinoconjunctivitis and asthma seen today in consultation requested by Lanier Prude, PA. She is transferring her care to our offices from Dr. Baird Lyons who is no longer practicing allergy.  She has been treated with aeroallergen immunotherapy for the past 25 years.  She discontinued immunotherapy a few years ago but restarted because her symptoms recurred while off the injections.  Her allergy symptoms consist of nasal congestion, rhinorrhea, sneezing, postnasal drainage, nasal pruritus, ocular pruritus, and sinus pressure.  These symptoms occur year around but are most frequent and severe in the spring, summer, and fall.  She attempts to control these symptoms with levocetirizine and Dymista.  She was diagnosed with asthma when she was 41 years old.  She states that she is unable to take inhaled corticosteroids, even with a spacer device, because of severe thrush which typically occurs within 2 days of starting the inhaler.  She has tried montelukast in the past without perceived benefit.  She uses albuterol prior to exercise but rarely needs it for rescue purposes.  She denies nocturnal awakenings due to lower respiratory symptoms.  Diane Ortiz has a history of food allergies.  When she was 41 years old, she consumed shellfish and developed nausea, vomiting, diarrhea, and the sensation of her throat closing.  When she was 57 or 41 years old, she experienced similar symptoms with the consumption of strawberries.  She also complains of pharyngeal pruritus and the sensation of her throat closing with the consumption of tree nuts.  She avoids these foods but her epinephrine autoinjectors have  expired.   Assessment and plan: Seasonal and perennial allergic rhinitis  Aeroallergen avoidance measures have been discussed and provided in written form.  For now, continue levocetirizine 5 mg daily as needed and Dymista, 2 sprays per nostril 1-2 times daily as needed.  I have also recommended nasal saline spray (i.e., Simply Saline) or nasal saline lavage (i.e., NeilMed) as needed and prior to medicated nasal sprays.  For thick post nasal drainage, add guaifenesin 1200 mg (Mucinex Maximum Strength)  twice daily as needed with adequate hydration as discussed.  The risks and benefits of aeroallergen immunotherapy have been discussed. The patient is motivated to resume/restart immunotherapy to reduce symptoms and decrease medication requirement. Informed consent has been signed and allergen vaccine orders have been submitted. Medications will be decreased or discontinued as symptom relief from immunotherapy becomes evident.  Allergic conjunctivitis  Treatment plan as outlined above for allergic rhinitis.  A prescription has been provided for Pazeo, one drop per eye daily as needed.  I have also recommended eye lubricant drops (i.e., Natural Tears) as needed.  Mild intermittent asthma  Continue albuterol HFA, 1-2 inhalations every 4-6 hours as needed and 15 minutes prior to vigorous exercise.  Subjective and objective measures of pulmonary function will be followed and the treatment plan will be adjusted accordingly.  Food allergy  Meticulous avoidance of tree nuts, strawberries, and shellfish as discussed.  A prescription has been provided for epinephrine auto-injector 2 pack along with instructions for proper administration.  A food allergy action plan has been provided and discussed.  Medic Alert identification is recommended.   Meds ordered this encounter  Medications  .  levocetirizine (XYZAL) 5 MG tablet    Sig: Take 1 tablet (5 mg total) by mouth every evening.     Dispense:  34 tablet    Refill:  5  . EPINEPHrine (AUVI-Q) 0.3 mg/0.3 mL IJ SOAJ injection    Sig: Use as directed for severe allergic reactions    Dispense:  2 Device    Refill:  3  . Azelastine HCl 0.15 % SOLN    Sig: Place 1-2 sprays into the nose 2 (two) times daily as needed.    Dispense:  30 mL    Refill:  3  . Olopatadine HCl (PATADAY) 0.2 % SOLN    Sig: Place 1 drop into both eyes 1 day or 1 dose.    Dispense:  1 Bottle    Refill:  5    Diagnostics: Spirometry: FVC was 3.20 L and FEV1 was 2.26 L (86% predicted) with 70 mL postbronchodilator improvement. This study was performed while the patient was asymptomatic.  Please see scanned spirometry results for details. Environmental skin testing: Positive to weed pollens, ragweed pollen, tree pollens, molds, cat hair, dog epithelia, and dust mite antigen. Food allergen skin testing: Positive to cashew, shrimp, and strawberry.    Physical examination: Blood pressure 110/80, pulse 96, temperature 98.8 F (37.1 C), temperature source Oral, resp. rate 16, height 4' 11.84" (1.52 m), weight 148 lb 5.9 oz (67.3 kg).  General: Alert, interactive, in no acute distress. HEENT: TMs pearly gray, turbinates moderately edematous with crusty discharge, post-pharynx mildly erythematous. Neck: Supple without lymphadenopathy. Lungs: Clear to auscultation without wheezing, rhonchi or rales. CV: Normal S1, S2 without murmurs. Abdomen: Nondistended, nontender. Skin: Warm and dry, without lesions or rashes. Extremities:  No clubbing, cyanosis or edema. Neuro:   Grossly intact.  Review of systems:  Review of systems negative except as noted in HPI / PMHx or noted below: Review of Systems  Constitutional: Negative.   HENT: Negative.   Eyes: Negative.   Respiratory: Negative.   Cardiovascular: Negative.   Gastrointestinal: Negative.   Genitourinary: Negative.   Musculoskeletal: Negative.   Skin: Negative.   Neurological: Negative.    Endo/Heme/Allergies: Negative.   Psychiatric/Behavioral: Negative.     Past medical history:  Past Medical History:  Diagnosis Date  . Asthma    pro air PRN  . Chronic interstitial cystitis   . Constipation   . Contact dermatitis and other eczema, due to unspecified cause   . Depressive disorder, not elsewhere classified   . Dysthymic disorder   . Elevated C-reactive protein (CRP)   . Esophageal reflux   . Essential thrombocythemia ([place])   . Family history of colon cancer   . Fatty liver   . Generalized anxiety disorder   . Hepatic cyst   . Hepatitis, unspecified   . Hiatal hernia   . Hyperlipemia   . Hypertension   . Hypotension, unspecified   . Incomplete bladder emptying   . Irregular menstrual cycle   . Irritable bowel syndrome   . Migraine, unspecified, without mention of intractable migraine without mention of status migrainosus   . Muscle weakness (generalized)   . Myalgia and myositis, unspecified   . Overweight(278.02)   . Pain in joint, multiple sites   . Personal history of unspecified circulatory disease   . Polycystic kidney, unspecified type   . Polycystic ovaries    metformin  . Restless legs syndrome (RLS)   . Rhabdomyolysis   . Sicca syndrome ([place])   . Tubular adenoma of  colon   . Unspecified asthma     Past surgical history:  Past Surgical History:  Procedure Laterality Date  . APPENDECTOMY  1980's  . CESAREAN SECTION     x2  . TONSILLECTOMY  1980's  . TUBAL LIGATION  2010    Family history: Family History  Problem Relation Age of Onset  . Allergies Mother   . Asthma Mother   . Food Allergy Mother   . Heart disease Father   . Heart disease Maternal Grandfather   . Colon cancer Paternal Grandmother   . Kidney cancer Unknown        aunt  . Breast cancer Unknown   . Bone cancer Unknown   . Asthma Maternal Grandmother   . Allergic rhinitis Maternal Grandmother   . Sinusitis Son   . Allergic rhinitis Son   . Asthma Son   . Food  Allergy Son   . Urticaria Son   . Angioedema Neg Hx   . Eczema Neg Hx     Social history: Social History   Social History  . Marital status: Married    Spouse name: N/A  . Number of children: N/A  . 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 Smoking status: Never Smoker  . Smokeless tobacco: Never Used  . Alcohol use No  . Drug use: No  . Sexual activity: Not on file   Other Topics Concern  . Not on file   Social History Narrative  . No narrative on file   Environmental History: The patient lives in a 41 year old house with hardwood floors throughout and central air/heat.  There is a dog in house which does not have access to her bedroom.  There is no known mold/water damage in the home.  She is a nonsmoker.  Allergies as of 06/04/2016      Reactions   Aspirin Shortness Of Breath, Other (See Comments)   Other reaction(s): Other (See Comments) Shortness of breath after consistently taking it for a few days Bronchospasm Shortness of breath after consistently taking it for a few days   Ivp Dye [iodinated Diagnostic Agents] Shortness Of Breath, Nausea And Vomiting, Cough   Metrizamide Other (See Comments), Nausea And Vomiting, Shortness Of Breath   Other reaction(s): Cough Other reaction(s): Other (See Comments) unknown unknown   Penicillins Shortness Of Breath, Cough, Other (See Comments)   Other reaction(s): Cough Has patient had a PCN reaction causing immediate rash, facial/tongue/throat swelling, SOB or lightheadedness with hypotension: yes Has patient had a PCN reaction causing severe rash involving mucus membranes or skin necrosis:no Has patient had a PCN reaction that required hospitalization: no Has patient had a PCN reaction occurring within the last 10 years: yes If all of the above answers are "NO", then may proceed with Cephalosporin use. Bronchospasm Has patient had a PCN reaction  causing immediate rash, facial/tongue/throat swelling, SOB or lightheadedness with hypotension: yes Has patient had a PCN reaction causing severe rash involving mucus membranes or skin necrosis:no Has patient had a PCN reaction that required hospitalization: no Has patient had a PCN reaction occurring within the last 10 years: yes If all of the above answers are "NO", then may proceed with Cephalosporin use.   Qvar [beclomethasone]    Thrush, sore throat, unable to sleep Thrush, sore throat, unable to sleep Thrush, sore throat, unable to sleep   Shellfish Allergy Shortness Of Breath, Nausea And Vomiting,  Cough   Shellfish-derived Products Nausea And Vomiting, Shortness Of Breath   Other reaction(s): Cough   Strawberry Extract Shortness Of Breath, Rash, Cough   Other reaction(s): Cough chocking chocking   Cephalexin Swelling   REACTION: swelling   Ibuprofen Other (See Comments)   Other reaction(s): Other (See Comments) REACTION: due to kidney disease REACTION: due to kidney disease   Latex Rash      Medication List       Accurate as of 06/04/16  4:55 PM. Always use your most recent med list.          acetaminophen 325 MG tablet Commonly known as:  TYLENOL Take 650 mg by mouth as needed.   AEROCHAMBER MV inhaler Use as instructed   AMBULATORY NON FORMULARY MEDICATION Medication Name: domperidone 10 mg before meals and at bedtime   Azelastine HCl 0.15 % Soln Place 1-2 sprays into the nose 2 (two) times daily as needed.   Azelastine-Fluticasone 137-50 MCG/ACT Susp 1 spray by Each Nare route Two (2) times a day.   busPIRone 15 MG tablet Commonly known as:  BUSPAR Take 15 mg by mouth at bedtime.   butalbital-acetaminophen-caffeine 50-325-40 MG tablet Commonly known as:  FIORICET, ESGIC Take 1 tablet by mouth every 6 (six) hours as needed for headache or migraine. Take as needed for migraines   COZAAR 25 MG tablet Generic drug:  losartan Take 25 mg by mouth at  bedtime.   losartan 25 MG tablet Commonly known as:  COZAAR Take 25 mg by mouth.   CYMBALTA 60 MG capsule Generic drug:  DULoxetine Take 60 mg by mouth daily.   diphenhydrAMINE 25 mg capsule Commonly known as:  BENADRYL Take 25 mg by mouth as needed.   EPINEPHrine 0.3 mg/0.3 mL Soaj injection Commonly known as:  EPI-PEN Inject into thigh for severe allergic reaction   EPINEPHrine 0.3 mg/0.3 mL Soaj injection Commonly known as:  AUVI-Q Use as directed for severe allergic reactions   hydrochlorothiazide 25 MG tablet Commonly known as:  HYDRODIURIL Take 25 mg by mouth.   Ketorolac Tromethamine 15.75 MG/SPRAY Soln Place 15.75 mg into the nose.   levocetirizine 5 MG tablet Commonly known as:  XYZAL Take 5 mg by mouth.   levocetirizine 5 MG tablet Commonly known as:  XYZAL Take 1 tablet (5 mg total) by mouth every evening.   LORazepam 0.5 MG tablet Commonly known as:  ATIVAN Take 1 mg by mouth.   Melatonin 3 MG Caps Take by mouth.   metFORMIN 500 MG 24 hr tablet Commonly known as:  GLUCOPHAGE-XR Take 500 mg by mouth 2 (two) times daily.   NATURE-THROID 97.5 MG Tabs Generic drug:  Thyroid Take 97.5 mg by mouth daily.   NONFORMULARY OR COMPOUNDED ITEM Inject 1 Dose into the skin once a week. Allergy Vaccine 1:10 Given at Home   Olopatadine HCl 0.2 % Soln Commonly known as:  PATADAY Place 1 drop into both eyes 1 day or 1 dose.   PRESCRIPTION MEDICATION Take 50 mg by mouth at bedtime. Progesterone 50mg  compounded capsule take for 3 weeks then stop while on cycle   PRESCRIPTION MEDICATION Take 10 mg by mouth 3 (three) times daily as needed (digestion). Domperidone 10mg  tablets pt gets from [facility], to take with meals TID but pt hasn't been taking as prescribed recently   PRESCRIPTION MEDICATION Apply 1 application topically daily. Testosterone cream   PRESCRIPTION MEDICATION Take 1 capsule by mouth daily. Vitamin T24 + Folic acid   PROBIOTIC DAILY  PO Take 1  tablet by mouth daily.   RABEprazole 20 MG tablet Commonly known as:  ACIPHEX Take 20 mg by mouth.   sertraline 50 MG tablet Commonly known as:  ZOLOFT Take 75 mg by mouth daily.   spironolactone 50 MG tablet Commonly known as:  ALDACTONE Take 50 mg by mouth.   topiramate 25 MG tablet Commonly known as:  TOPAMAX TAKE 1 TABLET (25 MG TOTAL) BY MOUTH DAILY.   VYVANSE 30 MG capsule Generic drug:  lisdexamfetamine Take 30 mg by mouth daily.       Known medication allergies: Allergies  Allergen Reactions  . Aspirin Shortness Of Breath and Other (See Comments)    Other reaction(s): Other (See Comments) Shortness of breath after consistently taking it for a few days Bronchospasm Shortness of breath after consistently taking it for a few days  . Ivp Dye [Iodinated Diagnostic Agents] Shortness Of Breath, Nausea And Vomiting and Cough  . Metrizamide Other (See Comments), Nausea And Vomiting and Shortness Of Breath    Other reaction(s): Cough Other reaction(s): Other (See Comments) unknown unknown  . Penicillins Shortness Of Breath, Cough and Other (See Comments)    Other reaction(s): Cough Has patient had a PCN reaction causing immediate rash, facial/tongue/throat swelling, SOB or lightheadedness with hypotension: yes Has patient had a PCN reaction causing severe rash involving mucus membranes or skin necrosis:no Has patient had a PCN reaction that required hospitalization: no Has patient had a PCN reaction occurring within the last 10 years: yes If all of the above answers are "NO", then may proceed with Cephalosporin use. Bronchospasm Has patient had a PCN reaction causing immediate rash, facial/tongue/throat swelling, SOB or lightheadedness with hypotension: yes Has patient had a PCN reaction causing severe rash involving mucus membranes or skin necrosis:no Has patient had a PCN reaction that required hospitalization: no Has patient had a PCN reaction occurring within the  last 10 years: yes If all of the above answers are "NO", then may proceed with Cephalosporin use.   . Qvar [Beclomethasone]     Thrush, sore throat, unable to sleep Thrush, sore throat, unable to sleep Thrush, sore throat, unable to sleep  . Shellfish Allergy Shortness Of Breath, Nausea And Vomiting and Cough  . Shellfish-Derived Products Nausea And Vomiting and Shortness Of Breath    Other reaction(s): Cough  . Strawberry Extract Shortness Of Breath, Rash and Cough    Other reaction(s): Cough chocking chocking  . Cephalexin Swelling    REACTION: swelling  . Ibuprofen Other (See Comments)    Other reaction(s): Other (See Comments) REACTION: due to kidney disease REACTION: due to kidney disease  . Latex Rash    I appreciate the opportunity to take part in [place] care. Please do not hesitate to contact me with questions.  Sincerely,   R. Edgar Frisk, MD

## 2016-06-08 DIAGNOSIS — J301 Allergic rhinitis due to pollen: Secondary | ICD-10-CM | POA: Diagnosis not present

## 2016-06-09 DIAGNOSIS — J3089 Other allergic rhinitis: Secondary | ICD-10-CM | POA: Diagnosis not present

## 2016-06-09 NOTE — Progress Notes (Signed)
Vials to be made on 06/09/16 DG

## 2016-06-18 ENCOUNTER — Ambulatory Visit: Payer: BLUE CROSS/BLUE SHIELD

## 2016-06-19 ENCOUNTER — Ambulatory Visit (INDEPENDENT_AMBULATORY_CARE_PROVIDER_SITE_OTHER): Payer: BLUE CROSS/BLUE SHIELD

## 2016-06-19 DIAGNOSIS — J309 Allergic rhinitis, unspecified: Secondary | ICD-10-CM | POA: Diagnosis not present

## 2016-06-29 ENCOUNTER — Ambulatory Visit (INDEPENDENT_AMBULATORY_CARE_PROVIDER_SITE_OTHER): Payer: BLUE CROSS/BLUE SHIELD

## 2016-06-29 DIAGNOSIS — J309 Allergic rhinitis, unspecified: Secondary | ICD-10-CM

## 2016-07-09 ENCOUNTER — Ambulatory Visit (INDEPENDENT_AMBULATORY_CARE_PROVIDER_SITE_OTHER): Payer: BLUE CROSS/BLUE SHIELD

## 2016-07-09 DIAGNOSIS — J309 Allergic rhinitis, unspecified: Secondary | ICD-10-CM | POA: Diagnosis not present

## 2016-07-21 ENCOUNTER — Ambulatory Visit (INDEPENDENT_AMBULATORY_CARE_PROVIDER_SITE_OTHER): Payer: BLUE CROSS/BLUE SHIELD

## 2016-07-21 DIAGNOSIS — J309 Allergic rhinitis, unspecified: Secondary | ICD-10-CM | POA: Diagnosis not present

## 2016-07-24 ENCOUNTER — Telehealth: Payer: Self-pay | Admitting: Internal Medicine

## 2016-07-24 MED ORDER — AMBULATORY NON FORMULARY MEDICATION: 1 refills | Status: DC

## 2016-07-24 NOTE — Telephone Encounter (Signed)
Domperidone rx faxed to [facility] online and informed patient to call the toll free number provided. Also, informed patient to keep appt for any further refills. Patient verbalized understanding.

## 2016-08-03 ENCOUNTER — Ambulatory Visit (INDEPENDENT_AMBULATORY_CARE_PROVIDER_SITE_OTHER): Payer: BLUE CROSS/BLUE SHIELD | Admitting: *Deleted

## 2016-08-03 DIAGNOSIS — J309 Allergic rhinitis, unspecified: Secondary | ICD-10-CM

## 2016-08-10 ENCOUNTER — Encounter: Payer: Self-pay | Admitting: Allergy and Immunology

## 2016-08-10 ENCOUNTER — Ambulatory Visit (INDEPENDENT_AMBULATORY_CARE_PROVIDER_SITE_OTHER): Payer: BLUE CROSS/BLUE SHIELD | Admitting: Allergy and Immunology

## 2016-08-10 VITALS — BP 110/78 | HR 95 | Temp 98.4°F | Resp 19

## 2016-08-10 DIAGNOSIS — J45901 Unspecified asthma with (acute) exacerbation: Secondary | ICD-10-CM | POA: Diagnosis not present

## 2016-08-10 DIAGNOSIS — B370 Candidal stomatitis: Secondary | ICD-10-CM | POA: Diagnosis not present

## 2016-08-10 DIAGNOSIS — J0100 Acute maxillary sinusitis, unspecified: Secondary | ICD-10-CM | POA: Diagnosis not present

## 2016-08-10 DIAGNOSIS — J019 Acute sinusitis, unspecified: Secondary | ICD-10-CM | POA: Insufficient documentation

## 2016-08-10 MED ORDER — PREDNISONE 1 MG PO TABS: 10.0000 mg | ORAL_TABLET | Freq: Every day | ORAL | Status: AC

## 2016-08-10 MED ORDER — ALBUTEROL SULFATE HFA 108 (90 BASE) MCG/ACT IN AERS: 1.0000 | INHALATION_SPRAY | Freq: Four times a day (QID) | RESPIRATORY_TRACT | 2 refills | Status: DC | PRN

## 2016-08-10 MED ORDER — AZELASTINE HCL 0.1 % NA SOLN: NASAL | 5 refills | Status: DC

## 2016-08-10 MED ORDER — TIOTROPIUM BROMIDE MONOHYDRATE 1.25 MCG/ACT IN AERS: 2.0000 | INHALATION_SPRAY | Freq: Every day | RESPIRATORY_TRACT | 5 refills | Status: DC

## 2016-08-10 NOTE — Assessment & Plan Note (Signed)
   Continue Nystatin as prescribed and avoidance of inhaled corticosteroids.

## 2016-08-10 NOTE — Patient Instructions (Addendum)
Asthma with acute exacerbation  Prednisone has been provided, 40 mg x3 days, 20 mg x1 day, 10 mg x1 day, then stop.  A prescription has been provided for Spiriva 1.25 mcg, 2 inhalations daily.  Continue montelukast 10 mg daily bedtime and albuterol HFA, 1-2 inhalations every 4-6 hours as needed.  The patient has been asked to contact me if her symptoms persist or progress. Otherwise, she may return for follow up in 4 months.  Acute sinusitis  Prednisone has been provided (as above).  A prescription has been provided for azelastine nasal spray, 1-2 sprays per nostril 2 times daily as needed. Proper nasal spray technique has been discussed and demonstrated.   I have also recommended nasal saline spray (i.e., Simply Saline) or nasal saline lavage (i.e., NeilMed) as needed and prior to medicated nasal sprays.  For thick post nasal drainage, nasal congestion, and/or sinus pressure, add guaifenesin 1200 mg (Mucinex Maximum Strength) plus/minus pseudoephedrine 120 mg  twice daily as needed with adequate hydration as discussed. Pseudoephedrine is only to be used for short-term relief of nasal/sinus congestion. Long-term use is discouraged due to potential side effects.  The patient has been asked to contact me if her symptoms persist, progress, or if she becomes febrile.  Thrush  Continue Nystatin as prescribed and avoidance of inhaled corticosteroids.   Return in about 4 months (around 12/11/2016), or if symptoms worsen or fail to improve.

## 2016-08-10 NOTE — Assessment & Plan Note (Signed)
   Prednisone has been provided, 40 mg x3 days, 20 mg x1 day, 10 mg x1 day, then stop.  A prescription has been provided for Spiriva 1.25 mcg, 2 inhalations daily.  Continue montelukast 10 mg daily bedtime and albuterol HFA, 1-2 inhalations every 4-6 hours as needed.  The patient has been asked to contact me if her symptoms persist or progress. Otherwise, she may return for follow up in 4 months.

## 2016-08-10 NOTE — Progress Notes (Signed)
Follow-up Note  RE: Diane Ortiz MRN: [id number] DOB: 1975-12-23 Date of Office Visit: 08/10/2016  Primary care provider: Manfred Shirts, PA Referring provider: Manfred Shirts, PA  History of present illness: Diane Ortiz is a 41 y.o. female with persistent asthma and allergic rhinoconjunctivitis presenting today for follow up.  She was previously seen in this clinic for her initial evaluation on 06/04/2016.  She reports that 5 days ago she was picking blueberries and developed nasal congestion, thick postnasal drainage, throat irritation, sinus pressure, and ear pressure.  She denies fever, however states that she may have had some mild mucus discoloration this morning.  She is currently being treated for thrush with nystatin.  She is unable to tolerate inhaled corticosteroids because of thrush.  She reports that over the past few days she has been experiencing frequent asthma symptoms, to the point of seeking care at the local urgent care Saturday morning.  She states that she received 2 mg Decadron injection and was started on montelukast but reports that her symptoms have not significantly improved.    Assessment and plan: Asthma with acute exacerbation  Prednisone has been provided, 40 mg x3 days, 20 mg x1 day, 10 mg x1 day, then stop.  A prescription has been provided for Spiriva 1.25 mcg, 2 inhalations daily.  Continue montelukast 10 mg daily bedtime and albuterol HFA, 1-2 inhalations every 4-6 hours as needed.  The patient has been asked to contact me if her symptoms persist or progress. Otherwise, she may return for follow up in 4 months.  Acute sinusitis  Prednisone has been provided (as above).  A prescription has been provided for azelastine nasal spray, 1-2 sprays per nostril 2 times daily as needed. Proper nasal spray technique has been discussed and demonstrated.   I have also recommended nasal saline spray (i.e., Simply Saline) or nasal saline lavage (i.e.,  NeilMed) as needed and prior to medicated nasal sprays.  For thick post nasal drainage, nasal congestion, and/or sinus pressure, add guaifenesin 1200 mg (Mucinex Maximum Strength) plus/minus pseudoephedrine 120 mg  twice daily as needed with adequate hydration as discussed. Pseudoephedrine is only to be used for short-term relief of nasal/sinus congestion. Long-term use is discouraged due to potential side effects.  The patient has been asked to contact me if her symptoms persist, progress, or if she becomes febrile.  Thrush  Continue Nystatin as prescribed and avoidance of inhaled corticosteroids.   Meds ordered this encounter  Medications  . Tiotropium Bromide Monohydrate (SPIRIVA RESPIMAT) 1.25 MCG/ACT AERS    Sig: Inhale 2 puffs into the lungs daily.    Dispense:  4 g    Refill:  5  . azelastine (ASTELIN) 0.1 % nasal spray    Sig: 1-2 sprays per nostril 2 times daily as needed.    Dispense:  30 mL    Refill:  5  . albuterol (PROVENTIL HFA;VENTOLIN HFA) 108 (90 Base) MCG/ACT inhaler    Sig: Inhale 1-2 puffs into the lungs every 6 (six) hours as needed for wheezing or shortness of breath.    Dispense:  1 Inhaler    Refill:  2  . predniSONE (DELTASONE) tablet 10 mg    Diagnostics: Tree reveals an FVC of 2.53 L (87% predicted) and an FEV1 of 1.84 L (74% predicted) without significant postbronchodilator improvement.   Please see scanned spirometry results for details.    Physical examination: Blood pressure 110/78, pulse 95, temperature 98.4 F (36.9 C), temperature source Oral, resp. rate  19, SpO2 96 %.  General: Alert, interactive, in no acute distress. HEENT: TMs pearly gray, turbinates edematous with thick discharge, post-pharynx erythematous. Neck: Supple without lymphadenopathy. Lungs: Clear to auscultation without wheezing, rhonchi or rales. CV: Normal S1, S2 without murmurs. Skin: Warm and dry, without lesions or rashes.  The following portions of the patient's  history were reviewed and updated as appropriate: allergies, current medications, past family history, past medical history, past social history, past surgical history and problem list.  Allergies as of 08/10/2016      Reactions   Aspirin Shortness Of Breath, Other (See Comments)   Other reaction(s): Other (See Comments) Other reaction(s): Other (See Comments) Shortness of breath after consistently taking it for a few days Bronchospasm Shortness of breath after consistently taking it for a few days Other reaction(s): Other (See Comments) Shortness of breath after consistently taking it for a few days Bronchospasm Other reaction(s): Other (See Comments) Shortness of breath after consistently taking it for a few days Bronchospasm Shortness of breath after consistently taking it for a few days   Glucosamine Nausea And Vomiting, Shortness Of Breath   Other reaction(s): Cough   Ivp Dye [iodinated Diagnostic Agents] Shortness Of Breath, Nausea And Vomiting, Cough   Metrizamide Other (See Comments), Nausea And Vomiting, Shortness Of Breath   Other reaction(s): Other (See Comments) Other reaction(s): Cough Other reaction(s): Other (See Comments) unknown unknown Other reaction(s): Cough Other reaction(s): Other (See Comments) unknown Other reaction(s): Cough Other reaction(s): Other (See Comments) unknown unknown   Penicillins Shortness Of Breath, Cough, Other (See Comments)   Other reaction(s): Cough, Other (See Comments) Other reaction(s): Cough Has patient had a PCN reaction causing immediate rash, facial/tongue/throat swelling, SOB or lightheadedness with hypotension: yes Has patient had a PCN reaction causing severe rash involving mucus membranes or skin necrosis:no Has patient had a PCN reaction that required hospitalization: no Has patient had a PCN reaction occurring within the last 10 years: yes If all of the above answers are "NO", then may proceed with Cephalosporin  use. Bronchospasm Has patient had a PCN reaction causing immediate rash, facial/tongue/throat swelling, SOB or lightheadedness with hypotension: yes Has patient had a PCN reaction causing severe rash involving mucus membranes or skin necrosis:no Has patient had a PCN reaction that required hospitalization: no Has patient had a PCN reaction occurring within the last 10 years: yes If all of the above answers are "NO", then may proceed with Cephalosporin use. Other reaction(s): Cough Has patient had a PCN reaction causing immediate rash, facial/tongue/throat swelling, SOB or lightheadedness with hypotension: yes Has patient had a PCN reaction causing severe rash involving mucus membranes or skin necrosis:no Has patient had a PCN reaction that required hospitalization: no Has patient had a PCN reaction occurring within the last 10 years: yes If all of the above answers are "NO", then may proceed with Cephalosporin use. Bronchospasm Other reaction(s): Cough Has patient had a PCN reaction causing immediate rash, facial/tongue/throat swelling, SOB or lightheadedness with hypotension: yes Has patient had a PCN reaction causing severe rash involving mucus membranes or skin necrosis:no Has patient had a PCN reaction that required hospitalization: no Has patient had a PCN reaction occurring within the last 10 years: yes If all of the above answers are "NO", then may proceed with Cephalosporin use. Bronchospasm Has patient had a PCN reaction causing immediate rash, facial/tongue/throat swelling, SOB or lightheadedness with hypotension: yes Has patient had a PCN reaction causing severe rash involving mucus membranes or skin necrosis:no Has patient  had a PCN reaction that required hospitalization: no Has patient had a PCN reaction occurring within the last 10 years: yes If all of the above answers are "NO", then may proceed with Cephalosporin use.   Qvar [beclomethasone]    Thrush, sore throat, unable  to sleep Thrush, sore throat, unable to sleep Thrush, sore throat, unable to sleep Thrush, sore throat, unable to sleep Thrush, sore throat, unable to sleep Thrush, sore throat, unable to sleep Thrush, sore throat, unable to sleep Thrush, sore throat, unable to sleep   Shellfish Allergy Shortness Of Breath, Nausea And Vomiting, Cough   Shellfish-derived Products Nausea And Vomiting, Shortness Of Breath   Other reaction(s): Cough   Strawberry Extract Shortness Of Breath, Rash, Cough   Other reaction(s): Cough Other reaction(s): Cough chocking chocking Other reaction(s): Cough chocking Other reaction(s): Cough chocking chocking   Cephalexin Swelling   REACTION: swelling   Ibuprofen Other (See Comments)   Other reaction(s): Other (See Comments) Other reaction(s): Other (See Comments) REACTION: due to kidney disease REACTION: due to kidney disease Other reaction(s): Other (See Comments) REACTION: due to kidney disease Other reaction(s): Other (See Comments) REACTION: due to kidney disease REACTION: due to kidney disease   Latex Rash      Medication List       Accurate as of 08/10/16  9:23 PM. Always use your most recent med list.          acetaminophen 325 MG tablet Commonly known as:  TYLENOL Take 650 mg by mouth as needed.   AEROCHAMBER MV inhaler Use as instructed   albuterol 108 (90 Base) MCG/ACT inhaler Commonly known as:  PROVENTIL HFA;VENTOLIN HFA Inhale 1-2 puffs into the lungs every 6 (six) hours as needed for wheezing or shortness of breath.   AMBULATORY NON FORMULARY MEDICATION Medication Name: domperidone 10 mg before meals and at bedtime   azelastine 0.1 % nasal spray Commonly known as:  ASTELIN 1-2 sprays per nostril 2 times daily as needed.   Azelastine-Fluticasone 137-50 MCG/ACT Susp 1 spray by Each Nare route Two (2) times a day.   busPIRone 15 MG tablet Commonly known as:  BUSPAR Take 15 mg by mouth at bedtime.    butalbital-acetaminophen-caffeine 50-325-40 MG tablet Commonly known as:  FIORICET, ESGIC Take 1 tablet by mouth every 6 (six) hours as needed for headache or migraine. Take as needed for migraines   COZAAR 25 MG tablet Generic drug:  losartan Take 25 mg by mouth at bedtime.   losartan 25 MG tablet Commonly known as:  COZAAR Take 25 mg by mouth.   CYMBALTA 60 MG capsule Generic drug:  DULoxetine Take 60 mg by mouth daily.   diphenhydrAMINE 25 mg capsule Commonly known as:  BENADRYL Take 25 mg by mouth as needed.   EPINEPHrine 0.3 mg/0.3 mL Soaj injection Commonly known as:  EPI-PEN Inject into thigh for severe allergic reaction   EPINEPHrine 0.3 mg/0.3 mL Soaj injection Commonly known as:  AUVI-Q Use as directed for severe allergic reactions   hydrochlorothiazide 25 MG tablet Commonly known as:  HYDRODIURIL Take 25 mg by mouth.   Ketorolac Tromethamine 15.75 MG/SPRAY Soln Place 15.75 mg into the nose.   levocetirizine 5 MG tablet Commonly known as:  XYZAL Take 1 tablet (5 mg total) by mouth every evening.   LORazepam 0.5 MG tablet Commonly known as:  ATIVAN Take 1 mg by mouth.   Melatonin 3 MG Caps Take by mouth.   metFORMIN 500 MG 24 hr tablet Commonly known  as:  GLUCOPHAGE-XR Take 500 mg by mouth 2 (two) times daily.   NATURE-THROID 97.5 MG Tabs Generic drug:  Thyroid Take 97.5 mg by mouth daily.   NONFORMULARY OR COMPOUNDED ITEM Inject 1 Dose into the skin once a week. Allergy Vaccine 1:10 Given at Home   Olopatadine HCl 0.2 % Soln Commonly known as:  PATADAY Place 1 drop into both eyes 1 day or 1 dose.   PRESCRIPTION MEDICATION Take 50 mg by mouth at bedtime. Progesterone 50mg  compounded capsule take for 3 weeks then stop while on cycle   PRESCRIPTION MEDICATION Take 10 mg by mouth 3 (three) times daily as needed (digestion). Domperidone 10mg  tablets pt gets from [place], to take with meals TID but pt hasn't been taking as prescribed recently    PRESCRIPTION MEDICATION Apply 1 application topically daily. Testosterone cream   PRESCRIPTION MEDICATION Take 1 capsule by mouth daily. Vitamin M19 + Folic acid   PROBIOTIC DAILY PO Take 1 tablet by mouth daily.   RABEprazole 20 MG tablet Commonly known as:  ACIPHEX Take 20 mg by mouth.   sertraline 50 MG tablet Commonly known as:  ZOLOFT Take 75 mg by mouth daily.   spironolactone 50 MG tablet Commonly known as:  ALDACTONE Take 50 mg by mouth.   Tiotropium Bromide Monohydrate 1.25 MCG/ACT Aers Commonly known as:  SPIRIVA RESPIMAT Inhale 2 puffs into the lungs daily.   topiramate 25 MG tablet Commonly known as:  TOPAMAX TAKE 1 TABLET (25 MG TOTAL) BY MOUTH DAILY.   VYVANSE 30 MG capsule Generic drug:  lisdexamfetamine Take 30 mg by mouth daily.       Allergies  Allergen Reactions  . Aspirin Shortness Of Breath and Other (See Comments)    Other reaction(s): Other (See Comments) Other reaction(s): Other (See Comments) Shortness of breath after consistently taking it for a few days Bronchospasm Shortness of breath after consistently taking it for a few days Other reaction(s): Other (See Comments) Shortness of breath after consistently taking it for a few days Bronchospasm Other reaction(s): Other (See Comments) Shortness of breath after consistently taking it for a few days Bronchospasm Shortness of breath after consistently taking it for a few days  . Glucosamine Nausea And Vomiting and Shortness Of Breath    Other reaction(s): Cough  . Ivp Dye [Iodinated Diagnostic Agents] Shortness Of Breath, Nausea And Vomiting and Cough  . Metrizamide Other (See Comments), Nausea And Vomiting and Shortness Of Breath    Other reaction(s): Other (See Comments) Other reaction(s): Cough Other reaction(s): Other (See Comments) unknown unknown Other reaction(s): Cough Other reaction(s): Other (See Comments) unknown Other reaction(s): Cough Other reaction(s): Other (See  Comments) unknown unknown  . Penicillins Shortness Of Breath, Cough and Other (See Comments)    Other reaction(s): Cough, Other (See Comments) Other reaction(s): Cough Has patient had a PCN reaction causing immediate rash, facial/tongue/throat swelling, SOB or lightheadedness with hypotension: yes Has patient had a PCN reaction causing severe rash involving mucus membranes or skin necrosis:no Has patient had a PCN reaction that required hospitalization: no Has patient had a PCN reaction occurring within the last 10 years: yes If all of the above answers are "NO", then may proceed with Cephalosporin use. Bronchospasm Has patient had a PCN reaction causing immediate rash, facial/tongue/throat swelling, SOB or lightheadedness with hypotension: yes Has patient had a PCN reaction causing severe rash involving mucus membranes or skin necrosis:no Has patient had a PCN reaction that required hospitalization: no Has patient had a PCN reaction  occurring within the last 10 years: yes If all of the above answers are "NO", then may proceed with Cephalosporin use. Other reaction(s): Cough Has patient had a PCN reaction causing immediate rash, facial/tongue/throat swelling, SOB or lightheadedness with hypotension: yes Has patient had a PCN reaction causing severe rash involving mucus membranes or skin necrosis:no Has patient had a PCN reaction that required hospitalization: no Has patient had a PCN reaction occurring within the last 10 years: yes If all of the above answers are "NO", then may proceed with Cephalosporin use. Bronchospasm Other reaction(s): Cough Has patient had a PCN reaction causing immediate rash, facial/tongue/throat swelling, SOB or lightheadedness with hypotension: yes Has patient had a PCN reaction causing severe rash involving mucus membranes or skin necrosis:no Has patient had a PCN reaction that required hospitalization: no Has patient had a PCN reaction occurring within the  last 10 years: yes If all of the above answers are "NO", then may proceed with Cephalosporin use. Bronchospasm Has patient had a PCN reaction causing immediate rash, facial/tongue/throat swelling, SOB or lightheadedness with hypotension: yes Has patient had a PCN reaction causing severe rash involving mucus membranes or skin necrosis:no Has patient had a PCN reaction that required hospitalization: no Has patient had a PCN reaction occurring within the last 10 years: yes If all of the above answers are "NO", then may proceed with Cephalosporin use.   . Qvar [Beclomethasone]     Thrush, sore throat, unable to sleep Thrush, sore throat, unable to sleep Thrush, sore throat, unable to sleep Thrush, sore throat, unable to sleep Thrush, sore throat, unable to sleep Thrush, sore throat, unable to sleep Thrush, sore throat, unable to sleep Thrush, sore throat, unable to sleep  . Shellfish Allergy Shortness Of Breath, Nausea And Vomiting and Cough  . Shellfish-Derived Products Nausea And Vomiting and Shortness Of Breath    Other reaction(s): Cough  . Strawberry Extract Shortness Of Breath, Rash and Cough    Other reaction(s): Cough Other reaction(s): Cough chocking chocking Other reaction(s): Cough chocking Other reaction(s): Cough chocking chocking  . Cephalexin Swelling    REACTION: swelling  . Ibuprofen Other (See Comments)    Other reaction(s): Other (See Comments) Other reaction(s): Other (See Comments) REACTION: due to kidney disease REACTION: due to kidney disease Other reaction(s): Other (See Comments) REACTION: due to kidney disease Other reaction(s): Other (See Comments) REACTION: due to kidney disease REACTION: due to kidney disease  . Latex Rash   Review of systems: Review of systems negative except as noted in HPI / PMHx or noted below: Constitutional: Negative.  HENT: Negative.   Eyes: Negative.  Respiratory: Negative.   Cardiovascular: Negative.    Gastrointestinal: Negative.  Genitourinary: Negative.  Musculoskeletal: Negative.  Neurological: Negative.  Endo/Heme/Allergies: Negative.  Cutaneous: Negative.  Past Medical History:  Diagnosis Date  . Asthma    pro air PRN  . Chronic interstitial cystitis   . Constipation   . Contact dermatitis and other eczema, due to unspecified cause   . Depressive disorder, not elsewhere classified   . Dysthymic disorder   . Elevated C-reactive protein (CRP)   . Esophageal reflux   . Essential thrombocythemia ([place])   . Family history of colon cancer   . Fatty liver   . Generalized anxiety disorder   . Hepatic cyst   . Hepatitis, unspecified   . Hiatal hernia   . Hyperlipemia   . Hypertension   . Hypotension, unspecified   . Incomplete bladder emptying   . Irregular  menstrual cycle   . Irritable bowel syndrome   . Migraine, unspecified, without mention of intractable migraine without mention of status migrainosus   . Muscle weakness (generalized)   . Myalgia and myositis, unspecified   . Overweight(278.02)   . Pain in joint, multiple sites   . Personal history of unspecified circulatory disease   . Polycystic kidney, unspecified type   . Polycystic ovaries    metformin  . Restless legs syndrome (RLS)   . Rhabdomyolysis   . Sicca syndrome ([place])   . Tubular adenoma of colon   . Unspecified asthma     Family History  Problem Relation Age of Onset  . Allergies Mother   . Asthma Mother   . Food Allergy Mother   . Heart disease Father   . Heart disease Maternal Grandfather   . Colon cancer Paternal Grandmother   . Kidney cancer Unknown        aunt  . Breast cancer Unknown   . Bone cancer Unknown   . Asthma Maternal Grandmother   . Allergic rhinitis Maternal Grandmother   . Sinusitis Son   . Allergic rhinitis Son   . Asthma Son   . Food Allergy Son   . Urticaria Son   . Angioedema Neg Hx   . Eczema Neg Hx     Social History   Social History  . Marital status:  Married    Spouse name: N/A  . Number of children: N/A  . 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 Smoking status: Never Smoker  . Smokeless tobacco: Never Used  . Alcohol use No  . Drug use: No  . Sexual activity: Yes    Birth control/ protection: None   Other Topics Concern  . Not on file   Social History Narrative  . No narrative on file    I appreciate the opportunity to take part in [facility] care. Please do not hesitate to contact me with questions.  Sincerely,   R. Edgar Frisk, MD

## 2016-08-10 NOTE — Assessment & Plan Note (Signed)
   Prednisone has been provided (as above).  A prescription has been provided for azelastine nasal spray, 1-2 sprays per nostril 2 times daily as needed. Proper nasal spray technique has been discussed and demonstrated.   I have also recommended nasal saline spray (i.e., Simply Saline) or nasal saline lavage (i.e., NeilMed) as needed and prior to medicated nasal sprays.  For thick post nasal drainage, nasal congestion, and/or sinus pressure, add guaifenesin 1200 mg (Mucinex Maximum Strength) plus/minus pseudoephedrine 120 mg  twice daily as needed with adequate hydration as discussed. Pseudoephedrine is only to be used for short-term relief of nasal/sinus congestion. Long-term use is discouraged due to potential side effects.  The patient has been asked to contact me if her symptoms persist, progress, or if she becomes febrile.

## 2016-08-12 ENCOUNTER — Telehealth: Payer: Self-pay

## 2016-08-12 MED ORDER — AZITHROMYCIN 250 MG PO TABS: ORAL_TABLET | ORAL | 0 refills | Status: DC

## 2016-08-12 NOTE — Telephone Encounter (Signed)
Called patient. Left message informing patient that we sent Azithromycin into the [facility]. Take 2 250 mg tablets on day one and 1 250 mg, on days 2-5. Continue Prednisone that was given on Monday Per Dr. Verlin Fester.

## 2016-08-12 NOTE — Addendum Note (Signed)
Addended by: Angelica Ran on: 08/12/2016 04:12 PM   Modules accepted: Orders

## 2016-08-12 NOTE — Telephone Encounter (Signed)
Please advise 

## 2016-08-12 NOTE — Telephone Encounter (Signed)
Patient was seen on 08/10/2016 by Dr. Verlin Fester , she stated Dr. Verlin Fester told her to call back in a few days if she is not feeling better. Patient stated she is doing everything as instructed and feels worse. She would like to know what else to do.   Please Advise  Patient can be reached at Work [phone]  Please ask for Kim.

## 2016-08-12 NOTE — Telephone Encounter (Signed)
Prednisone, 20 mg x 4 days, 10 mg x1 day, then stop. Azithromycin, 500 mg on day 1 and 250 mg on days 2 through 5. Continue nasal saline lavage, etc.

## 2016-08-12 NOTE — Telephone Encounter (Signed)
Informed patient of antibiotic. Sent script into [facility].

## 2016-08-23 ENCOUNTER — Telehealth: Payer: Self-pay | Admitting: Allergy

## 2016-08-23 ENCOUNTER — Other Ambulatory Visit: Payer: Self-pay | Admitting: Allergy

## 2016-08-23 MED ORDER — DOXYCYCLINE HYCLATE 100 MG PO TABS: 100.0000 mg | ORAL_TABLET | Freq: Two times a day (BID) | ORAL | 0 refills | Status: AC

## 2016-08-23 MED ORDER — PREDNISONE 10 MG PO TABS: ORAL_TABLET | ORAL | 0 refills | Status: DC

## 2016-08-23 NOTE — Progress Notes (Signed)
Called by pt.   States her symptoms have returned.   She was treated for acute sinusitis and asthma exacerbation with prednisone and zpak which she finished a week ago.  She felt her symptoms had improved however this past week started noticing return of symptoms and yesterday symptoms worsened.  States the sinus pressure never really improved and she still feels a lot of pressure under her eyes and cheeks.  She is also had return of SOB and cough that is keeping up her.  Using albuterol today q4h with some relief.  Taking maintenance meds of spirvia, singulair, xyzal and nasal saline spray.  States can't take ICS due to issues with thrush.   Advise she get saline rinse kit.   Feel she has had partial treatment of sinusitis and rx Doxycyline x7 days and further prednisone taper.  Advised she call office this week if symptoms not improved.  Advised to hold AIT until using albuterol less and she if feeling better. Advised s/sx to go to ED/UC for further eval.

## 2016-08-24 NOTE — Telephone Encounter (Signed)
See orders only note

## 2016-08-26 ENCOUNTER — Ambulatory Visit (INDEPENDENT_AMBULATORY_CARE_PROVIDER_SITE_OTHER): Payer: BLUE CROSS/BLUE SHIELD

## 2016-08-26 DIAGNOSIS — J309 Allergic rhinitis, unspecified: Secondary | ICD-10-CM | POA: Diagnosis not present

## 2016-09-10 ENCOUNTER — Ambulatory Visit (INDEPENDENT_AMBULATORY_CARE_PROVIDER_SITE_OTHER): Payer: BLUE CROSS/BLUE SHIELD | Admitting: *Deleted

## 2016-09-10 DIAGNOSIS — J309 Allergic rhinitis, unspecified: Secondary | ICD-10-CM

## 2016-09-24 ENCOUNTER — Ambulatory Visit (INDEPENDENT_AMBULATORY_CARE_PROVIDER_SITE_OTHER): Payer: BLUE CROSS/BLUE SHIELD | Admitting: *Deleted

## 2016-09-24 DIAGNOSIS — J309 Allergic rhinitis, unspecified: Secondary | ICD-10-CM

## 2016-10-01 ENCOUNTER — Ambulatory Visit (INDEPENDENT_AMBULATORY_CARE_PROVIDER_SITE_OTHER): Payer: BLUE CROSS/BLUE SHIELD | Admitting: Internal Medicine

## 2016-10-01 ENCOUNTER — Ambulatory Visit (INDEPENDENT_AMBULATORY_CARE_PROVIDER_SITE_OTHER): Payer: BLUE CROSS/BLUE SHIELD | Admitting: *Deleted

## 2016-10-01 ENCOUNTER — Encounter: Payer: Self-pay | Admitting: Internal Medicine

## 2016-10-01 VITALS — BP 114/72 | HR 82 | Ht 60.0 in | Wt 149.0 lb

## 2016-10-01 DIAGNOSIS — K581 Irritable bowel syndrome with constipation: Secondary | ICD-10-CM | POA: Diagnosis not present

## 2016-10-01 DIAGNOSIS — K219 Gastro-esophageal reflux disease without esophagitis: Secondary | ICD-10-CM | POA: Diagnosis not present

## 2016-10-01 DIAGNOSIS — K30 Functional dyspepsia: Secondary | ICD-10-CM | POA: Diagnosis not present

## 2016-10-01 DIAGNOSIS — J309 Allergic rhinitis, unspecified: Secondary | ICD-10-CM

## 2016-10-01 DIAGNOSIS — Z86010 Personal history of colonic polyps: Secondary | ICD-10-CM | POA: Diagnosis not present

## 2016-10-01 DIAGNOSIS — K3189 Other diseases of stomach and duodenum: Secondary | ICD-10-CM | POA: Diagnosis not present

## 2016-10-01 MED ORDER — AMBULATORY NON FORMULARY MEDICATION: 5 refills | Status: DC

## 2016-10-01 NOTE — Progress Notes (Signed)
Subjective:    Patient ID: Diane Ortiz, female    DOB: 1975/02/21, 41 y.o.   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 She was last seen nearly 2 years ago. She is here alone today.  On the whole she reports that she has been doing well. She has started seeing a wellness clinic, [facility]. Through this she has seen a nutritionist all started about 4 weeks ago. She's been advised to eating extremely low-carb, paleo diet.  With this she has felt better. Some of her stomach issues seem less severe in her joint pains of also improved. She's also tried intermittent fasting which sounds like skipping a meal every several days. She has chosen dinner and so she will fast from 3 PM until 7 AM the next day. Occasionally this has caused her to overcompensate or "cheat" with her diet the next day.  She remains on AcipHex which he normally takes before dinner. This works well but she will still have some regurgitation of food or fluid if she bends over. Heartburn is mostly controlled. No dysphagia or odynophagia. No nausea or vomiting. When using domperidone "correctly" she has less upper abdominal fullness and bloating. She uses it 10 mg before meals and at bedtime but inconsistently. She requests refills  Bowel movements have been more regular recently with her change in diet. She has a history of constipation. She remains on magnesium daily which helps. She's having a bowel movement every other day. No diarrhea. No blood in stool or melena.  She is aware of I recommendation for recall colonoscopy. She reports due to medical expenses from this year due to her son having developed kidney stones leading to multiple doctors visits and treatment she needs/wishes to delay her colonoscopy  Review of Systems As per history  of present illness, otherwise negative  Current Medications, Allergies, Past Medical History, Past Surgical History, Family History and Social History were reviewed in [employer] record.     Objective:   Physical Exam BP 114/72   Pulse 82   Ht 5' (1.524 m)   Wt 149 lb (67.6 kg)   BMI 29.10 kg/m  Constitutional: Well-developed and well-nourished. No distress. HEENT: Normocephalic and atraumatic. Oropharynx is clear and moist. Conjunctivae are normal.  No scleral icterus. Neck: Neck supple. Trachea midline. Cardiovascular: Normal rate, regular rhythm and intact distal pulses. No M/R/G Pulmonary/chest: Effort normal and breath sounds normal. No wheezing, rales or rhonchi. Abdominal: Soft, nontender, nondistended. Bowel sounds active throughout. There are no masses palpable. No hepatosplenomegaly. Extremities: no clubbing, cyanosis, or edema Neurological: Alert and oriented to person place and time. Skin: Skin is warm and dry. Psychiatric: Normal mood and affect. Behavior is normal.     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  1. IBS/functional dyspepsia with nonspecific motility disorder/GERD -- she is doing well overall. She continues to benefit from AcipHex. We discussed the risk benefits and alternatives to long-term PPI and she wishes to continue. Will continue AcipHex 20 mg daily. We discussed how her hiatal hernia will predispose reflux even with acid suppression. Avoiding strenuous activity with a full stomach will likely help prevent some of the symptoms. We reviewed domperidone, she will need an  EKG. if normal QT interval then will resume domperidone 10 mg 3 times a day before meals and at bedtime as needed.  2. Constipation -- continue high-fiber diet. Can continue magnesium supplementation.  3.  History of adenomatous colon polyps -- slightly overdue for surveillance colonoscopy. I discussed the importance of repeat colonoscopy at this time. She has financial constraints. I advised that she schedule this as soon as financially possible for her. She feels this may be possible early next year. I advised a diet high in fiber and low in red meat which has been proven to possibly reduce colon polyps and colon cancer.  Annual follow-up in the office 25 minutes spent with the patient today. Greater than 50% was spent in counseling and coordination of care with the patient

## 2016-10-01 NOTE — Patient Instructions (Signed)
Continue Aciphex.  We have sent the following medications to your pharmacy for you to pick up at your convenience: Domperidone  Please follow up with Dr Hilarie Fredrickson in 1 year.  It has been recommended to you by your physician that you have a(n) colonoscopy completed. Per your request, we did not schedule the procedure(s) today. Please contact our office at [phone] should you decide to have the procedure completed.  If you are age 41 or older, your body mass index should be between 23-30. Your Body mass index is 29.1 kg/m. If this is out of the aforementioned range listed, please consider follow up with your Primary Care Provider.  If you are age 41 or younger, your body mass index should be between 19-25. Your Body mass index is 29.1 kg/m. If this is out of the aformentioned range listed, please consider follow up with your Primary Care Provider.

## 2016-10-08 ENCOUNTER — Ambulatory Visit (INDEPENDENT_AMBULATORY_CARE_PROVIDER_SITE_OTHER): Payer: BLUE CROSS/BLUE SHIELD | Admitting: *Deleted

## 2016-10-08 DIAGNOSIS — J309 Allergic rhinitis, unspecified: Secondary | ICD-10-CM | POA: Diagnosis not present

## 2016-10-23 ENCOUNTER — Encounter: Payer: Self-pay | Admitting: Allergy & Immunology

## 2016-10-23 ENCOUNTER — Ambulatory Visit (INDEPENDENT_AMBULATORY_CARE_PROVIDER_SITE_OTHER): Payer: BLUE CROSS/BLUE SHIELD | Admitting: Allergy & Immunology

## 2016-10-23 VITALS — BP 126/76 | HR 94 | Temp 98.4°F | Resp 18

## 2016-10-23 DIAGNOSIS — J45901 Unspecified asthma with (acute) exacerbation: Secondary | ICD-10-CM | POA: Diagnosis not present

## 2016-10-23 DIAGNOSIS — J3089 Other allergic rhinitis: Secondary | ICD-10-CM

## 2016-10-23 DIAGNOSIS — J069 Acute upper respiratory infection, unspecified: Secondary | ICD-10-CM

## 2016-10-23 DIAGNOSIS — B9789 Other viral agents as the cause of diseases classified elsewhere: Secondary | ICD-10-CM

## 2016-10-23 DIAGNOSIS — J4530 Mild persistent asthma, uncomplicated: Secondary | ICD-10-CM

## 2016-10-23 DIAGNOSIS — J302 Other seasonal allergic rhinitis: Secondary | ICD-10-CM | POA: Diagnosis not present

## 2016-10-23 MED ORDER — ALBUTEROL SULFATE HFA 108 (90 BASE) MCG/ACT IN AERS: 1.0000 | INHALATION_SPRAY | Freq: Four times a day (QID) | RESPIRATORY_TRACT | 2 refills | Status: DC | PRN

## 2016-10-23 NOTE — Patient Instructions (Addendum)
1. Mild persistent asthma without complication - Lung testing actually looked stable compared to the last visit and there was no improvement with the albuterol nebulizer. - We will get some lab work to see if you qualify for Xolair or one of the anti-IL5 agents (Nucala or Grace). - We will call you in 1-2 weeks with the results.  - We will send in a new albuterol prescription so that you have this at home to use as needed. - Prednisone pack provided in case symptoms acutely worsen.  2. Viral URI with cough - Continue with over the counter cough suppressants. - You can start the prednisone pack if you are not feeling better in the next day or so.  3. Seasonal and perennial allergic rhinitis - Continue with allergy shots at the same schedule. - Continue with Dymista 1-2 sprays per nostril daily.  4. Return in about 3 months (around 01/23/2017).   Please inform us of any Emergency Department visits, hospitalizations, or changes in symptoms. Call us before going to the ED for breathing or allergy symptoms since we might be able to fit you in for a sick visit. Feel free to contact us anytime with any questions, problems, or concerns.  It was a pleasure to meet you today! Enjoy the upcoming fall season!  Websites that have reliable patient information: 1. American Academy of Asthma, Allergy, and Immunology: www.aaaai.org 2. Food Allergy Research and Education (FARE): foodallergy.org 3. Mothers of Asthmatics: http://www.asthmacommunitynetwork.org 4. American College of Allergy, Asthma, and Immunology: www.acaai.org   Election Day is coming up on Tuesday, November 6th! Make your voice heard! Register to vote at [email or web address]!     [place] Board of Elections: [email or web address]  [employer] of Elections:  [email or web address]

## 2016-10-23 NOTE — Progress Notes (Signed)
FOLLOW UP  Date of Service/Encounter:  10/23/16   Assessment:   Mild persistent asthma without complication   Viral URI with cough  Seasonal and perennial allergic rhinitis (grasses, weeds, trees, molds, dust mite, cat, dog)   Asthma Reportables:  Severity: mild persistent  Risk: high Control: very poorly controlled   Plan/Recommendations:   1. Mild persistent asthma without complication - Lung testing actually looked stable compared to the last visit and there was no improvement with the albuterol nebulizer. - We will get some lab work to see if you qualify for Xolair or one of the anti-IL5 agents (Nucala or [place]). - We will call you in 1-2 weeks with the results.  - We will send in a new albuterol prescription so that you have this at home to use as needed. - Prednisone pack provided in case symptoms acutely worsen.  2. Viral URI with cough - Continue with over the counter cough suppressants. - You can start the prednisone pack if you are not feeling better in the next day or so.  3. Seasonal and perennial allergic rhinitis - Continue with allergy shots at the same schedule. - Continue with Dymista 1-2 sprays per nostril daily.  4. Return in about 3 months (around 01/23/2017).  Subjective:   Diane Ortiz is a 41 y.o. female presenting today for follow up of  Chief Complaint  Patient presents with  . Asthma    chest tightness, difficulty breathing. unable to find her albuterol inhaler.     Diane Ortiz has a history of the following: Patient Active Problem List   Diagnosis Date Noted  . Acute sinusitis 08/10/2016  . Allergic conjunctivitis 06/04/2016  . Asthma with acute exacerbation 06/04/2016  . Thrush 10/10/2011  . Seasonal and perennial allergic rhinitis 06/03/2009  . Food allergy 06/03/2009  . HYPERLIPIDEMIA 04/26/2009  . THROMBOCYTOSIS 04/26/2009  . ANXIETY DEPRESSION 04/26/2009  . HYPERTENSION 04/26/2009  . Allergic-infective asthma  04/26/2009  . ACID REFLUX DISEASE 04/26/2009  . FATTY LIVER DISEASE 04/26/2009  . POLYCYSTIC KIDNEY DISEASE 04/26/2009  . HEART MURMUR, HX OF 04/26/2009    History obtained from: chart review and patient.  Diane Ortiz Primary Care Provider is Beane, Mirian Mo, [place].     Diane Ortiz is a 41 y.o. female presenting for a sick visit. She was last seen in July 2018 by Dr. Verlin Fester. At that time, she was treated for an asthma exacerbation. She was started on a prednisone burst and a prescription for Spiriva was provided. She was also continued on Singulair 10mg  daily. She was diagnosed with sinusitis as well and an azelastine prescription was provided. She was also encouraged to use Mucinex twice daily. Her first visit was in May 2018, and this will be her second asthma exacerbation since she started following with our practice.   Since the last visit, she has not done well. She did restart her Spiriva one week ago when she symptoms resurfaced. She lost her albuterol which has not helped at all. She has had one week of coughing and shortness of breath. She currently works in a Teacher, English as a foreign language. Coughing occurs all night and through the day. Her son is sick with similar symptoms; evidently he was diagnosed with a viral infection (Rapid Strep was negative). Mom has been treating her son and herself with Mucinex DM and other DM containing cough medications. She is also using honey containing cough drops.    She has a history of problems with inhaled steroids because she is very  susceptible to thrush. She is currently seeing a "wellness doctor" to get rid of the thrush. Her last course of prednisone was in July and then prior to that, it was in January. She has never been worked up to see if she would qualify for a biologic, but review of her labs shows that she has never had an elevated AEC.   The only change is her allergy shot script. She was previously followed by Dr. Annamaria Boots. Otherwise, she is unsure of any new  triggers that might be explaining her increased frequency. She denies new exposures that would explain her symptoms.   [name] is on allergen immunotherapy. She receives two injections. Immunotherapy script #1 contains trees, weeds and grasses. She currently receives 0.39mL of the BLUE vial (1/100,000). Immunotherapy script #2 contains molds, dust mites, cat and dog. She currently receives 0.17mL of the BLUE vial (1/100,000). She started shots June of 2018 and not yet reached maintenance.  Otherwise, there have been no changes to her past medical history, surgical history, family history, or social history.    Review of Systems: a 14-point review of systems is pertinent for what is mentioned in HPI.  Otherwise, all other systems were negative. Constitutional: negative other than that listed in the HPI Eyes: negative other than that listed in the HPI Ears, nose, mouth, throat, and face: negative other than that listed in the HPI Respiratory: negative other than that listed in the HPI Cardiovascular: negative other than that listed in the HPI Gastrointestinal: negative other than that listed in the HPI Genitourinary: negative other than that listed in the HPI Integument: negative other than that listed in the HPI Hematologic: negative other than that listed in the HPI Musculoskeletal: negative other than that listed in the HPI Neurological: negative other than that listed in the HPI Allergy/Immunologic: negative other than that listed in the HPI    Objective:   Blood pressure 126/76, pulse 94, temperature 98.4 F (36.9 C), temperature source Oral, resp. rate 18, SpO2 98 %. There is no height or weight on file to calculate BMI.   Physical Exam:  General: Alert, interactive, in no acute distress. Pleasant. Dressed in scrubs. Eyes: No conjunctival injection bilaterally, no discharge on the right, no discharge on the left and no Horner-Trantas dots present. PERRL bilaterally. EOMI  without pain. No photophobia.  Ears: Right TM pearly gray with normal light reflex, Left TM pearly gray with normal light reflex, Right TM unable to be visualized due to cerumen impaction and Left TM unable to be visualized due to cerumen impaction.  Nose/Throat: External nose within normal limits and septum midline. Turbinates edematous and pale with clear discharge. Posterior oropharynx erythematous with cobblestoning in the posterior oropharynx. Tonsils 2+ without exudates.  Tongue without thrush and Geographic tongue present. Adenopathy: shoddy bilateral anterior cervical lymphadenopathy Lungs: Decreased breath sounds bilaterally without wheezing, rhonchi or rales. No increased work of breathing. Dry cough throughout the exam.  CV: Normal S1/S2. No murmurs. Capillary refill <2 seconds.  Skin: Warm and dry, without lesions or rashes. Neuro:   Grossly intact. No focal deficits appreciated. Responsive to questions.  Diagnostic studies:   Spirometry: results normal (FEV1: 2.11/80%, FVC: 2.70/84%, FEV1/FVC: 78%).    Spirometry consistent with normal pattern. Albuterol/Atrovent nebulizer treatment given in clinic with no improvement. Her FVC decreased to 2.43 and her FEV1 increased slightly from 2.11 to 2.23.   Allergy Studies: none     Salvatore Marvel, MD [facility]

## 2016-10-25 LAB — CBC WITH DIFFERENTIAL/PLATELET
BASOS ABS: 0.1 10*3/uL (ref 0.0–0.2)
Basos: 1 %
EOS (ABSOLUTE): 0.2 10*3/uL (ref 0.0–0.4)
Eos: 2 %
HEMOGLOBIN: 14 g/dL (ref 11.1–15.9)
Hematocrit: 43.1 % (ref 34.0–46.6)
Immature Grans (Abs): 0 10*3/uL (ref 0.0–0.1)
Immature Granulocytes: 0 %
LYMPHS ABS: 3.9 10*3/uL — AB (ref 0.7–3.1)
LYMPHS: 39 %
MCH: 26.5 pg — AB (ref 26.6–33.0)
MCHC: 32.5 g/dL (ref 31.5–35.7)
MCV: 82 fL (ref 79–97)
MONOCYTES: 8 %
Monocytes Absolute: 0.8 10*3/uL (ref 0.1–0.9)
NEUTROS ABS: 5 10*3/uL (ref 1.4–7.0)
Neutrophils: 50 %
Platelets: 428 10*3/uL — ABNORMAL HIGH (ref 150–379)
RBC: 5.29 x10E6/uL — AB (ref 3.77–5.28)
RDW: 15.4 % (ref 12.3–15.4)
WBC: 10.1 10*3/uL (ref 3.4–10.8)

## 2016-10-25 LAB — IGE: IGE (IMMUNOGLOBULIN E), SERUM: 9 [IU]/mL (ref 0–100)

## 2016-11-12 ENCOUNTER — Ambulatory Visit (INDEPENDENT_AMBULATORY_CARE_PROVIDER_SITE_OTHER): Payer: BLUE CROSS/BLUE SHIELD | Admitting: *Deleted

## 2016-11-12 DIAGNOSIS — J309 Allergic rhinitis, unspecified: Secondary | ICD-10-CM | POA: Diagnosis not present

## 2016-11-13 ENCOUNTER — Telehealth: Payer: Self-pay

## 2016-11-13 NOTE — Telephone Encounter (Signed)
Pt called stating she was interested in [place]. She would like for [name] to reach out regarding the cost of both and has questions.

## 2016-11-13 NOTE — Telephone Encounter (Signed)
L/M for patient to give me call to discuss biologics. Will need to do Nucala due to EOS count

## 2016-11-19 ENCOUNTER — Ambulatory Visit (INDEPENDENT_AMBULATORY_CARE_PROVIDER_SITE_OTHER): Payer: BLUE CROSS/BLUE SHIELD

## 2016-11-19 DIAGNOSIS — J309 Allergic rhinitis, unspecified: Secondary | ICD-10-CM | POA: Diagnosis not present

## 2016-11-19 NOTE — Telephone Encounter (Signed)
Discussed with patient to submit for Nucala due to her EOS count more likely to get Ins approval.  Also will send Info and get signature for copay. Will fax same

## 2016-12-02 ENCOUNTER — Telehealth: Payer: Self-pay

## 2016-12-02 NOTE — Telephone Encounter (Signed)
Pt called and stated that [facility] had contacted her needing to know how many boxes to mail out. I took the name and number of the personthat contacted her and I will attempt to call them. [phone] Barnett Applebaum

## 2016-12-07 ENCOUNTER — Ambulatory Visit: Payer: BLUE CROSS/BLUE SHIELD | Admitting: Allergy and Immunology

## 2016-12-07 NOTE — Telephone Encounter (Signed)
I will contact pharmacy

## 2016-12-21 ENCOUNTER — Encounter: Payer: Self-pay | Admitting: Allergy and Immunology

## 2016-12-21 ENCOUNTER — Ambulatory Visit (INDEPENDENT_AMBULATORY_CARE_PROVIDER_SITE_OTHER): Payer: BLUE CROSS/BLUE SHIELD | Admitting: Allergy and Immunology

## 2016-12-21 VITALS — BP 126/74 | HR 93 | Ht 60.0 in | Wt 153.6 lb

## 2016-12-21 DIAGNOSIS — J4540 Moderate persistent asthma, uncomplicated: Secondary | ICD-10-CM

## 2016-12-21 DIAGNOSIS — J3089 Other allergic rhinitis: Secondary | ICD-10-CM | POA: Diagnosis not present

## 2016-12-21 DIAGNOSIS — J0110 Acute frontal sinusitis, unspecified: Secondary | ICD-10-CM

## 2016-12-21 DIAGNOSIS — J302 Other seasonal allergic rhinitis: Secondary | ICD-10-CM

## 2016-12-21 DIAGNOSIS — J4530 Mild persistent asthma, uncomplicated: Secondary | ICD-10-CM | POA: Insufficient documentation

## 2016-12-21 DIAGNOSIS — L309 Dermatitis, unspecified: Secondary | ICD-10-CM

## 2016-12-21 MED ORDER — FLUTICASONE PROPIONATE 93 MCG/ACT NA EXHU: 2.0000 | INHALANT_SUSPENSION | Freq: Two times a day (BID) | NASAL | 5 refills | Status: DC

## 2016-12-21 MED ORDER — DOXYCYCLINE HYCLATE 100 MG PO TBEC: 100.0000 mg | DELAYED_RELEASE_TABLET | Freq: Two times a day (BID) | ORAL | 0 refills | Status: AC

## 2016-12-21 MED ORDER — CARBINOXAMINE MALEATE 6 MG PO TABS: 1.0000 | ORAL_TABLET | ORAL | 5 refills | Status: DC

## 2016-12-21 NOTE — Assessment & Plan Note (Signed)
   Continue appropriate aeroallergen avoidance measures and immunotherapy injections.  Treatment plan as outlined above for acute sinusitis.

## 2016-12-21 NOTE — Assessment & Plan Note (Signed)
Most likely viral exanthem.  Moisturize at the skin with Aquaphor and take right vent as needed for pruritus.

## 2016-12-21 NOTE — Progress Notes (Signed)
Follow-up Note  RE: Diane Ortiz MRN: [id number] DOB: Nov 06, 1975 Date of Office Visit: 12/21/2016  Primary care provider: Manfred Shirts, PA Referring provider: Manfred Shirts, PA  History of present illness: Diane Ortiz is a 41 y.o. female with persistent asthma and allergic rhinitis presenting today for sick visit.  She states that over the past week she has experienced "awful pressure" over her forehead, as well as postnasal drainage, nasal congestion, and coughing.  She has expectorated thick discolored mucus.  She denies fevers and chills.  She states that her sinus symptoms have been progressing.  She reports that over this past week she has pruritic rash on her lower extremities.  Upper respiratory tract symptoms started.  She reports that she has had numerous sick contacts at work. Her lower respiratory symptoms have been relatively well controlled with Spiriva and albuterol as needed.  She has required albuterol rescue for lower respiratory symptoms once or twice per week on average over the past month.  She denies nocturnal awakenings due to lower respiratory symptoms over the past 4 weeks.  She is scheduled to start Nucala this Thursday.    Assessment and plan: Acute sinusitis  Prednisone has been provided, 40 mg x3 days, 20 mg x1 day, 10 mg x1 day, then stop.  A prescription has been provided for doxycycline 100 mg twice daily times 10 days.  A prescription has been provided for RyVent (carbinoxamine maleate) 6mg  every 6-8 hours as needed.  A prescription has been provided for [facility], 2 actuations per nostril twice a day. Proper technique has been discussed and demonstrated.  Continue nasal saline lavage (Via [facility]) followed by azelastine nasal spray, 1-2 sprays per nostril 2 times daily.  The patient has been asked to contact me if her symptoms persist or progress. Otherwise, she may return for follow up in 4 months.  Moderate persistent asthma  Continue Spiriva,  1.25 g, 2 inhalations daily, and albuterol HFA, 1-2 inhalations every 4-6 hours as needed.  Start Nucala after sinus infection has resolved.   Subjective and objective measures of pulmonary function will be followed and the treatment plan will be adjusted accordingly.  Dermatitis Most likely viral exanthem.  Moisturize at the skin with Aquaphor and take right vent as needed for pruritus.  Seasonal and perennial allergic rhinitis  Continue appropriate aeroallergen avoidance measures and immunotherapy injections.  Treatment plan as outlined above for acute sinusitis.   Meds ordered this encounter  Medications  . doxycycline (DORYX) 100 MG EC tablet    Sig: Take 1 tablet (100 mg total) by mouth 2 (two) times daily for 10 days.    Dispense:  20 tablet    Refill:  0  . Fluticasone Propionate (XHANCE) 93 MCG/ACT EXHU    Sig: Place 2 sprays into the nose 2 (two) times daily.    Dispense:  32 mL    Refill:  5    [phone]  . Carbinoxamine Maleate (RYVENT) 6 MG TABS    Sig: Take 1 tablet by mouth See admin instructions. Every 6-8 hours as needed    Dispense:  30 tablet    Refill:  5    BIN [id number]   RX PCN [id number]   Group [id number]   Cardholder ID    [id number]      CASH PAY ONLY    Diagnostics: Spirometry:  Normal with an FEV1 of 85% predicted.  Please see scanned spirometry results for details.    Physical examination: Blood pressure 126/74,  pulse 93, height 5' (1.524 m), weight 153 lb 9.6 oz (69.7 kg), SpO2 97 %.  General: Alert, interactive, in no acute distress. HEENT: TMs pearly gray, turbinates edematous with thick discharge, post-pharynx erythematous. Neck: Supple without lymphadenopathy. Lungs: Clear to auscultation without wheezing, rhonchi or rales. CV: Normal S1, S2 without murmurs. Skin: scattered, slightly erythematous papules on the lower extremities.  The following portions of the patient's history were reviewed and updated as appropriate: allergies, current  medications, past family history, past medical history, past social history, past surgical history and problem list.  Allergies as of 12/21/2016      Reactions   Aspirin Shortness Of Breath, Other (See Comments)   Other reaction(s): Other (See Comments) Other reaction(s): Other (See Comments) Shortness of breath after consistently taking it for a few days Bronchospasm Shortness of breath after consistently taking it for a few days Other reaction(s): Other (See Comments) Shortness of breath after consistently taking it for a few days Bronchospasm Other reaction(s): Other (See Comments) Shortness of breath after consistently taking it for a few days Bronchospasm Shortness of breath after consistently taking it for a few days   Glucosamine Nausea And Vomiting, Shortness Of Breath   Other reaction(s): Cough   Ivp Dye [iodinated Diagnostic Agents] Shortness Of Breath, Nausea And Vomiting, Cough   Metrizamide Other (See Comments), Nausea And Vomiting, Shortness Of Breath   Other reaction(s): Other (See Comments) Other reaction(s): Cough Other reaction(s): Other (See Comments) unknown unknown Other reaction(s): Cough Other reaction(s): Other (See Comments) unknown Other reaction(s): Cough Other reaction(s): Other (See Comments) unknown unknown   Penicillins Shortness Of Breath, Cough, Other (See Comments)   Other reaction(s): Cough, Other (See Comments) Other reaction(s): Cough Has patient had a PCN reaction causing immediate rash, facial/tongue/throat swelling, SOB or lightheadedness with hypotension: yes Has patient had a PCN reaction causing severe rash involving mucus membranes or skin necrosis:no Has patient had a PCN reaction that required hospitalization: no Has patient had a PCN reaction occurring within the last 10 years: yes If all of the above answers are "NO", then may proceed with Cephalosporin use. Bronchospasm Has patient had a PCN reaction causing immediate rash,  facial/tongue/throat swelling, SOB or lightheadedness with hypotension: yes Has patient had a PCN reaction causing severe rash involving mucus membranes or skin necrosis:no Has patient had a PCN reaction that required hospitalization: no Has patient had a PCN reaction occurring within the last 10 years: yes If all of the above answers are "NO", then may proceed with Cephalosporin use. Other reaction(s): Cough Has patient had a PCN reaction causing immediate rash, facial/tongue/throat swelling, SOB or lightheadedness with hypotension: yes Has patient had a PCN reaction causing severe rash involving mucus membranes or skin necrosis:no Has patient had a PCN reaction that required hospitalization: no Has patient had a PCN reaction occurring within the last 10 years: yes If all of the above answers are "NO", then may proceed with Cephalosporin use. Bronchospasm Other reaction(s): Cough Has patient had a PCN reaction causing immediate rash, facial/tongue/throat swelling, SOB or lightheadedness with hypotension: yes Has patient had a PCN reaction causing severe rash involving mucus membranes or skin necrosis:no Has patient had a PCN reaction that required hospitalization: no Has patient had a PCN reaction occurring within the last 10 years: yes If all of the above answers are "NO", then may proceed with Cephalosporin use. Bronchospasm Has patient had a PCN reaction causing immediate rash, facial/tongue/throat swelling, SOB or lightheadedness with hypotension: yes Has patient had a  PCN reaction causing severe rash involving mucus membranes or skin necrosis:no Has patient had a PCN reaction that required hospitalization: no Has patient had a PCN reaction occurring within the last 10 years: yes If all of the above answers are "NO", then may proceed with Cephalosporin use.   Qvar [beclomethasone]    Thrush, sore throat, unable to sleep Thrush, sore throat, unable to sleep Thrush, sore throat, unable  to sleep Thrush, sore throat, unable to sleep Thrush, sore throat, unable to sleep Thrush, sore throat, unable to sleep Thrush, sore throat, unable to sleep Thrush, sore throat, unable to sleep   Shellfish Allergy Shortness Of Breath, Nausea And Vomiting, Cough   Shellfish-derived Products Nausea And Vomiting, Shortness Of Breath   Other reaction(s): Cough   Strawberry Extract Shortness Of Breath, Rash, Cough   Other reaction(s): Cough Other reaction(s): Cough chocking chocking Other reaction(s): Cough chocking Other reaction(s): Cough chocking chocking   Cephalexin Swelling   REACTION: swelling   Ibuprofen Other (See Comments)   Other reaction(s): Other (See Comments) Other reaction(s): Other (See Comments) REACTION: due to kidney disease REACTION: due to kidney disease Other reaction(s): Other (See Comments) REACTION: due to kidney disease Other reaction(s): Other (See Comments) REACTION: due to kidney disease REACTION: due to kidney disease   Latex Rash      Medication List        Accurate as of 12/21/16  7:18 PM. Always use your most recent med list.          acetaminophen 325 MG tablet Commonly known as:  TYLENOL Take 650 mg by mouth as needed.   AEROCHAMBER MV inhaler Use as instructed   albuterol 108 (90 Base) MCG/ACT inhaler Commonly known as:  PROVENTIL HFA;VENTOLIN HFA Inhale 1-2 puffs into the lungs every 6 (six) hours as needed for wheezing or shortness of breath.   AMBULATORY NON FORMULARY MEDICATION Medication Name: domperidone 10 mg before meals and at bedtime   Azelastine-Fluticasone 137-50 MCG/ACT Susp 1 spray by Each Nare route Two (2) times a day.   busPIRone 15 MG tablet Commonly known as:  BUSPAR Take 15 mg by mouth at bedtime.   Carbinoxamine Maleate 6 MG Tabs Commonly known as:  RYVENT Take 1 tablet by mouth See admin instructions. Every 6-8 hours as needed   CYMBALTA 60 MG capsule Generic drug:  DULoxetine Take 60 mg by mouth  daily.   diphenhydrAMINE 25 mg capsule Commonly known as:  BENADRYL Take 25 mg by mouth as needed.   doxycycline 100 MG EC tablet Commonly known as:  DORYX Take 1 tablet (100 mg total) by mouth 2 (two) times daily for 10 days.   EPINEPHrine 0.3 mg/0.3 mL Soaj injection Commonly known as:  EPI-PEN Inject into thigh for severe allergic reaction   EPINEPHrine 0.3 mg/0.3 mL Soaj injection Commonly known as:  AUVI-Q Use as directed for severe allergic reactions   Fluticasone Propionate 93 MCG/ACT Exhu Commonly known as:  XHANCE Place 2 sprays into the nose 2 (two) times daily.   hydrochlorothiazide 25 MG tablet Commonly known as:  HYDRODIURIL Take 25 mg by mouth.   Ketorolac Tromethamine 15.75 MG/SPRAY Soln Place 15.75 mg into the nose.   LORazepam 0.5 MG tablet Commonly known as:  ATIVAN Take 1 mg by mouth.   losartan 25 MG tablet Commonly known as:  COZAAR Take 25 mg by mouth.   Melatonin 3 MG Caps Take by mouth.   metFORMIN 500 MG 24 hr tablet Commonly known as:  GLUCOPHAGE-XR  Take 500 mg by mouth 2 (two) times daily.   NATURE-THROID 97.5 MG Tabs Generic drug:  Thyroid Take 97.5 mg by mouth daily.   NONFORMULARY OR COMPOUNDED ITEM Inject 1 Dose into the skin once a week. Allergy Vaccine 1:10 Given at [place] Take 50 mg by mouth at bedtime. Progesterone 50mg  compounded capsule take for 3 weeks then stop while on cycle   PRESCRIPTION MEDICATION Take 10 mg by mouth 3 (three) times daily as needed (digestion). Domperidone 10mg  tablets pt gets from [place], to take with meals TID but pt hasn't been taking as prescribed recently   PRESCRIPTION MEDICATION Apply 1 application topically daily. Testosterone cream   PRESCRIPTION MEDICATION Take 1 capsule by mouth daily. Vitamin P10 + Folic acid   PROBIOTIC DAILY PO Take 1 tablet by mouth daily.   RABEprazole 20 MG tablet Commonly known as:  ACIPHEX Take 20 mg by mouth.   sertraline 50  MG tablet Commonly known as:  ZOLOFT Take 75 mg by mouth daily.   spironolactone 50 MG tablet Commonly known as:  ALDACTONE Take 50 mg by mouth.   Tiotropium Bromide Monohydrate 1.25 MCG/ACT Aers Commonly known as:  SPIRIVA RESPIMAT Inhale 2 puffs into the lungs daily.   topiramate 25 MG tablet Commonly known as:  TOPAMAX TAKE 1 TABLET (25 MG TOTAL) BY MOUTH DAILY.   VYVANSE 30 MG capsule Generic drug:  lisdexamfetamine Take 30 mg by mouth daily.       Allergies  Allergen Reactions  . Aspirin Shortness Of Breath and Other (See Comments)    Other reaction(s): Other (See Comments) Other reaction(s): Other (See Comments) Shortness of breath after consistently taking it for a few days Bronchospasm Shortness of breath after consistently taking it for a few days Other reaction(s): Other (See Comments) Shortness of breath after consistently taking it for a few days Bronchospasm Other reaction(s): Other (See Comments) Shortness of breath after consistently taking it for a few days Bronchospasm Shortness of breath after consistently taking it for a few days  . Glucosamine Nausea And Vomiting and Shortness Of Breath    Other reaction(s): Cough  . Ivp Dye [Iodinated Diagnostic Agents] Shortness Of Breath, Nausea And Vomiting and Cough  . Metrizamide Other (See Comments), Nausea And Vomiting and Shortness Of Breath    Other reaction(s): Other (See Comments) Other reaction(s): Cough Other reaction(s): Other (See Comments) unknown unknown Other reaction(s): Cough Other reaction(s): Other (See Comments) unknown Other reaction(s): Cough Other reaction(s): Other (See Comments) unknown unknown  . Penicillins Shortness Of Breath, Cough and Other (See Comments)    Other reaction(s): Cough, Other (See Comments) Other reaction(s): Cough Has patient had a PCN reaction causing immediate rash, facial/tongue/throat swelling, SOB or lightheadedness with hypotension: yes Has patient had  a PCN reaction causing severe rash involving mucus membranes or skin necrosis:no Has patient had a PCN reaction that required hospitalization: no Has patient had a PCN reaction occurring within the last 10 years: yes If all of the above answers are "NO", then may proceed with Cephalosporin use. Bronchospasm Has patient had a PCN reaction causing immediate rash, facial/tongue/throat swelling, SOB or lightheadedness with hypotension: yes Has patient had a PCN reaction causing severe rash involving mucus membranes or skin necrosis:no Has patient had a PCN reaction that required hospitalization: no Has patient had a PCN reaction occurring within the last 10 years: yes If all of the above answers are "NO", then may proceed with Cephalosporin use. Other reaction(s): Cough Has patient  had a PCN reaction causing immediate rash, facial/tongue/throat swelling, SOB or lightheadedness with hypotension: yes Has patient had a PCN reaction causing severe rash involving mucus membranes or skin necrosis:no Has patient had a PCN reaction that required hospitalization: no Has patient had a PCN reaction occurring within the last 10 years: yes If all of the above answers are "NO", then may proceed with Cephalosporin use. Bronchospasm Other reaction(s): Cough Has patient had a PCN reaction causing immediate rash, facial/tongue/throat swelling, SOB or lightheadedness with hypotension: yes Has patient had a PCN reaction causing severe rash involving mucus membranes or skin necrosis:no Has patient had a PCN reaction that required hospitalization: no Has patient had a PCN reaction occurring within the last 10 years: yes If all of the above answers are "NO", then may proceed with Cephalosporin use. Bronchospasm Has patient had a PCN reaction causing immediate rash, facial/tongue/throat swelling, SOB or lightheadedness with hypotension: yes Has patient had a PCN reaction causing severe rash involving mucus membranes or  skin necrosis:no Has patient had a PCN reaction that required hospitalization: no Has patient had a PCN reaction occurring within the last 10 years: yes If all of the above answers are "NO", then may proceed with Cephalosporin use.   . Qvar [Beclomethasone]     Thrush, sore throat, unable to sleep Thrush, sore throat, unable to sleep Thrush, sore throat, unable to sleep Thrush, sore throat, unable to sleep Thrush, sore throat, unable to sleep Thrush, sore throat, unable to sleep Thrush, sore throat, unable to sleep Thrush, sore throat, unable to sleep  . Shellfish Allergy Shortness Of Breath, Nausea And Vomiting and Cough  . Shellfish-Derived Products Nausea And Vomiting and Shortness Of Breath    Other reaction(s): Cough  . Strawberry Extract Shortness Of Breath, Rash and Cough    Other reaction(s): Cough Other reaction(s): Cough chocking chocking Other reaction(s): Cough chocking Other reaction(s): Cough chocking chocking  . Cephalexin Swelling    REACTION: swelling  . Ibuprofen Other (See Comments)    Other reaction(s): Other (See Comments) Other reaction(s): Other (See Comments) REACTION: due to kidney disease REACTION: due to kidney disease Other reaction(s): Other (See Comments) REACTION: due to kidney disease Other reaction(s): Other (See Comments) REACTION: due to kidney disease REACTION: due to kidney disease  . Latex Rash   Review of systems: Review of systems negative except as noted in HPI / PMHx or noted below: Constitutional: Negative.  HENT: Negative.   Eyes: Negative.  Respiratory: Negative.   Cardiovascular: Negative.  Gastrointestinal: Negative.  Genitourinary: Negative.  Musculoskeletal: Negative.  Neurological: Negative.  Endo/Heme/Allergies: Negative.  Cutaneous: Negative.  Past Medical History:  Diagnosis Date  . Asthma    pro air PRN  . Chronic interstitial cystitis   . Constipation   . Contact dermatitis and other eczema, due to  unspecified cause   . Depressive disorder, not elsewhere classified   . Dysthymic disorder   . Elevated C-reactive protein (CRP)   . Esophageal reflux   . Essential thrombocythemia ([place])   . Family history of colon cancer   . Fatty liver   . Generalized anxiety disorder   . Hepatic cyst   . Hepatitis, unspecified   . Hiatal hernia   . Hyperlipemia   . Hypertension   . Hypotension, unspecified   . Incomplete bladder emptying   . Irregular menstrual cycle   . Irritable bowel syndrome   . Migraine, unspecified, without mention of intractable migraine without mention of status migrainosus   . Muscle  weakness (generalized)   . Myalgia and myositis, unspecified   . Overweight(278.02)   . Pain in joint, multiple sites   . Personal history of unspecified circulatory disease   . Polycystic kidney, unspecified type   . Polycystic ovaries    metformin  . Restless legs syndrome (RLS)   . Rhabdomyolysis   . Sicca syndrome ([place])   . Tubular adenoma of colon   . Unspecified asthma     Family History  Problem Relation Age of Onset  . Allergies Mother   . Asthma Mother   . Food Allergy Mother   . Heart disease Father   . Heart disease Maternal Grandfather   . Colon cancer Paternal Grandmother   . Kidney cancer Unknown        aunt  . Breast cancer Unknown   . Bone cancer Unknown   . Asthma Maternal Grandmother   . Allergic rhinitis Maternal Grandmother   . Sinusitis Son   . Allergic rhinitis Son   . Asthma Son   . Food Allergy Son   . Urticaria Son   . Angioedema Neg Hx   . Eczema Neg Hx     Social History   Socioeconomic History  . Marital status: Married    Spouse name: Not on file  . Number of children: Not on file  . Years of education: Not on file  . Highest education level: Not on file  Social Needs  . Financial resource strain: Not on file  . Food insecurity - worry: Not on file  . Food insecurity - inability: Not on file  . Transportation needs - medical:  Not on file  . Transportation needs - non-medical: Not on file  Occupational History  . 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 Smoking status: Never Smoker  . Smokeless tobacco: Never Used  Substance and Sexual Activity  . Alcohol use: No  . Drug use: No  . Sexual activity: Yes    Birth control/protection: None  Other Topics Concern  . Not on file  Social History Narrative  . Not on file    I appreciate the opportunity to take part in [place] care. Please do not hesitate to contact me with questions.  Sincerely,   R. Edgar Frisk, MD

## 2016-12-21 NOTE — Assessment & Plan Note (Signed)
   Continue Spiriva, 1.25 g, 2 inhalations daily, and albuterol HFA, 1-2 inhalations every 4-6 hours as needed.  Start Nucala after sinus infection has resolved.   Subjective and objective measures of pulmonary function will be followed and the treatment plan will be adjusted accordingly.

## 2016-12-21 NOTE — Assessment & Plan Note (Signed)
   Prednisone has been provided, 40 mg x3 days, 20 mg x1 day, 10 mg x1 day, then stop.  A prescription has been provided for doxycycline 100 mg twice daily times 10 days.  A prescription has been provided for RyVent (carbinoxamine maleate) 6mg  every 6-8 hours as needed.  A prescription has been provided for [facility], 2 actuations per nostril twice a day. Proper technique has been discussed and demonstrated.  Continue nasal saline lavage (Via [facility]) followed by azelastine nasal spray, 1-2 sprays per nostril 2 times daily.  The patient has been asked to contact me if her symptoms persist or progress. Otherwise, she may return for follow up in 4 months.

## 2016-12-21 NOTE — Patient Instructions (Addendum)
Acute sinusitis  Prednisone has been provided, 40 mg x3 days, 20 mg x1 day, 10 mg x1 day, then stop.  A prescription has been provided for doxycycline 100 mg twice daily times 10 days.  A prescription has been provided for RyVent (carbinoxamine maleate) 6mg  every 6-8 hours as needed.  A prescription has been provided for [facility], 2 actuations per nostril twice a day. Proper technique has been discussed and demonstrated.  Continue nasal saline lavage (Via [facility]) followed by azelastine nasal spray, 1-2 sprays per nostril 2 times daily.  The patient has been asked to contact me if her symptoms persist or progress. Otherwise, she may return for follow up in 4 months.  Moderate persistent asthma  Continue Spiriva, 1.25 g, 2 inhalations daily, and albuterol HFA, 1-2 inhalations every 4-6 hours as needed.  Start Nucala after sinus infection has resolved.   Subjective and objective measures of pulmonary function will be followed and the treatment plan will be adjusted accordingly.  Dermatitis Most likely viral exanthem.  Moisturize at the skin with Aquaphor and take right vent as needed for pruritus.  Seasonal and perennial allergic rhinitis  Continue appropriate aeroallergen avoidance measures and immunotherapy injections.  Treatment plan as outlined above for acute sinusitis.   Return in about 4 months (around 04/21/2017), or if symptoms worsen or fail to improve.

## 2016-12-22 ENCOUNTER — Telehealth: Payer: Self-pay | Admitting: *Deleted

## 2016-12-22 NOTE — Telephone Encounter (Signed)
-----   Message from Larina Bras, [place] sent at 02/18/2016 10:44 AM EST ----- See 02/18/16 telephone note.... Needs repeat EKG for 12/2016 to continue domperidone.

## 2016-12-22 NOTE — Telephone Encounter (Signed)
Left voicemail for patient to call back. 

## 2016-12-23 NOTE — Telephone Encounter (Signed)
Left voicemail for patient to call back. 

## 2016-12-25 ENCOUNTER — Other Ambulatory Visit: Payer: Self-pay | Admitting: Allergy and Immunology

## 2016-12-25 DIAGNOSIS — J0110 Acute frontal sinusitis, unspecified: Secondary | ICD-10-CM

## 2016-12-25 MED ORDER — FLUTICASONE PROPIONATE 93 MCG/ACT NA EXHU: 2.0000 | INHALANT_SUSPENSION | Freq: Two times a day (BID) | NASAL | 5 refills | Status: DC

## 2016-12-25 NOTE — Telephone Encounter (Signed)
Received fax from [place] asking [place] to send new RX with updated directions for each nostril because order only said nasal. Clarification was sent.

## 2016-12-25 NOTE — Telephone Encounter (Signed)
Left voicemail for patient to call back. 

## 2016-12-30 ENCOUNTER — Ambulatory Visit (INDEPENDENT_AMBULATORY_CARE_PROVIDER_SITE_OTHER): Payer: BLUE CROSS/BLUE SHIELD | Admitting: *Deleted

## 2016-12-30 ENCOUNTER — Ambulatory Visit: Payer: BLUE CROSS/BLUE SHIELD

## 2016-12-30 DIAGNOSIS — J4550 Severe persistent asthma, uncomplicated: Secondary | ICD-10-CM | POA: Diagnosis not present

## 2016-12-30 MED ORDER — MEPOLIZUMAB 100 MG ~~LOC~~ SOLR
100.0000 mg | SUBCUTANEOUS | Status: AC
  Administered 2016-12-30 – 2017-02-25 (×3): 100 mg via SUBCUTANEOUS

## 2016-12-30 NOTE — Progress Notes (Signed)
Immunotherapy   Patient Details  Name: Diane Ortiz MRN: [id number] Date of Birth: Jan 04, 1976  12/30/2016  Diane Ortiz started injections for  Nucala 100mg   Frequency:Every 4 weeks Epi-Pen:Auvi-Q Available Consent signed and patient instructions given. Patient waited 60 min without problems.    Orlene Erm 12/30/2016, 3:32 PM

## 2016-12-30 NOTE — Telephone Encounter (Signed)
Unfortunately, we have been unable to get in touch with patient. We have sent letter to patient.

## 2016-12-31 ENCOUNTER — Ambulatory Visit: Payer: BLUE CROSS/BLUE SHIELD

## 2017-01-13 ENCOUNTER — Ambulatory Visit (INDEPENDENT_AMBULATORY_CARE_PROVIDER_SITE_OTHER): Payer: BLUE CROSS/BLUE SHIELD

## 2017-01-13 ENCOUNTER — Other Ambulatory Visit: Payer: Self-pay

## 2017-01-13 ENCOUNTER — Telehealth: Payer: Self-pay | Admitting: Internal Medicine

## 2017-01-13 DIAGNOSIS — J309 Allergic rhinitis, unspecified: Secondary | ICD-10-CM

## 2017-01-20 NOTE — Telephone Encounter (Signed)
[facility] for EGD in March We will need copy of the results for our record

## 2017-01-20 NOTE — Telephone Encounter (Signed)
Left message (as requested by patient) advising patient that she may have EKG in March and have the report sent over to [place].

## 2017-01-20 NOTE — Telephone Encounter (Signed)
[name] it looks like you were trying to reach this pt.

## 2017-01-20 NOTE — Telephone Encounter (Signed)
Dr Hilarie Fredrickson, I contacted patient to advise her that she is due for EKG if she wishes to be maintained on domperidone. She states that she is scheduled for routine office visit at her PCP on 04/01/17 and will get one at that time. Will this suffice or does she need to go ahead and have one now?

## 2017-01-21 ENCOUNTER — Ambulatory Visit (INDEPENDENT_AMBULATORY_CARE_PROVIDER_SITE_OTHER): Payer: BLUE CROSS/BLUE SHIELD

## 2017-01-21 DIAGNOSIS — J309 Allergic rhinitis, unspecified: Secondary | ICD-10-CM

## 2017-01-28 ENCOUNTER — Ambulatory Visit (INDEPENDENT_AMBULATORY_CARE_PROVIDER_SITE_OTHER): Payer: BLUE CROSS/BLUE SHIELD | Admitting: *Deleted

## 2017-01-28 ENCOUNTER — Ambulatory Visit: Payer: Self-pay

## 2017-01-28 DIAGNOSIS — J4550 Severe persistent asthma, uncomplicated: Secondary | ICD-10-CM | POA: Diagnosis not present

## 2017-02-11 ENCOUNTER — Encounter: Payer: Self-pay | Admitting: Internal Medicine

## 2017-02-18 ENCOUNTER — Ambulatory Visit (INDEPENDENT_AMBULATORY_CARE_PROVIDER_SITE_OTHER): Payer: BLUE CROSS/BLUE SHIELD | Admitting: *Deleted

## 2017-02-18 DIAGNOSIS — J309 Allergic rhinitis, unspecified: Secondary | ICD-10-CM

## 2017-02-25 ENCOUNTER — Ambulatory Visit (INDEPENDENT_AMBULATORY_CARE_PROVIDER_SITE_OTHER): Payer: BLUE CROSS/BLUE SHIELD | Admitting: *Deleted

## 2017-02-25 DIAGNOSIS — J4550 Severe persistent asthma, uncomplicated: Secondary | ICD-10-CM | POA: Diagnosis not present

## 2017-03-11 ENCOUNTER — Ambulatory Visit (INDEPENDENT_AMBULATORY_CARE_PROVIDER_SITE_OTHER): Payer: BLUE CROSS/BLUE SHIELD | Admitting: *Deleted

## 2017-03-11 DIAGNOSIS — J309 Allergic rhinitis, unspecified: Secondary | ICD-10-CM | POA: Diagnosis not present

## 2017-03-25 ENCOUNTER — Ambulatory Visit: Payer: BLUE CROSS/BLUE SHIELD

## 2017-04-01 ENCOUNTER — Ambulatory Visit (INDEPENDENT_AMBULATORY_CARE_PROVIDER_SITE_OTHER): Payer: BLUE CROSS/BLUE SHIELD | Admitting: *Deleted

## 2017-04-01 DIAGNOSIS — J309 Allergic rhinitis, unspecified: Secondary | ICD-10-CM

## 2017-04-22 ENCOUNTER — Ambulatory Visit (INDEPENDENT_AMBULATORY_CARE_PROVIDER_SITE_OTHER): Payer: BLUE CROSS/BLUE SHIELD | Admitting: *Deleted

## 2017-04-22 DIAGNOSIS — J309 Allergic rhinitis, unspecified: Secondary | ICD-10-CM | POA: Diagnosis not present

## 2017-04-26 ENCOUNTER — Ambulatory Visit: Payer: BLUE CROSS/BLUE SHIELD | Admitting: Allergy and Immunology

## 2017-05-18 ENCOUNTER — Encounter: Payer: Self-pay | Admitting: Allergy and Immunology

## 2017-05-18 ENCOUNTER — Ambulatory Visit (INDEPENDENT_AMBULATORY_CARE_PROVIDER_SITE_OTHER): Payer: BLUE CROSS/BLUE SHIELD | Admitting: Allergy and Immunology

## 2017-05-18 DIAGNOSIS — J4530 Mild persistent asthma, uncomplicated: Secondary | ICD-10-CM | POA: Diagnosis not present

## 2017-05-18 DIAGNOSIS — J3089 Other allergic rhinitis: Secondary | ICD-10-CM

## 2017-05-18 DIAGNOSIS — J302 Other seasonal allergic rhinitis: Secondary | ICD-10-CM | POA: Diagnosis not present

## 2017-05-18 NOTE — Assessment & Plan Note (Signed)
   Continue appropriate aeroallergen avoidance measures and immunotherapy injections.  To avoid diminishing benefit with daily use (tachyphylaxis) of second generation antihistamine, consider alternating every few months between fexofenadine (Allegra) and levocetirizine (Xyzal).  Nasal saline spray (i.e., Simply Saline) or nasal saline lavage (i.e., NeilMed) is recommended as needed.

## 2017-05-18 NOTE — Progress Notes (Signed)
Follow-up Note  RE: [name] MRN: [id number] DOB: [birth year] Date of Office Visit: 05/18/2017  Primary care provider: Manfred Shirts, PA Referring provider: Manfred Shirts, PA  History of present illness: Diane Ortiz is a 42 y.o. female with persistent asthma and allergic rhinitis presenting today for follow-up.  She was last seen in this clinic in December 2018.  She reports that in the interval since her previous visit she has been "eating clean, taking vitamins, and doing a detox" in an attempt to improve her health and decrease medications.  She reports that overall she has been feeling better.  She has discontinued BuSpar, Vyvanse, losartan, AcipHex, Spiriva, and Nucala.  She has only required albuterol rescue on one occasion over the past few months and she believes that her asthma symptoms were triggered by pollen exposure on that occasion.  She is not experiencing limitations in daily activities or nocturnal awakenings due to lower respiratory symptoms.  She reports that her nasal and ocular allergy symptoms are well controlled with levocetirizine daily.  She has no complaints today.  Assessment and plan: Asthma Currently well controlled.  Continue albuterol HFA, 1 to 2 inhalations every 6 hours if needed.  During upper respiratory tract infections and asthma flares, add Spiriva, 1.25 g, 2 inhalations daily until symptoms have returned to baseline.    Subjective and objective measures of pulmonary function will be followed and the treatment plan will be adjusted accordingly.  Seasonal and perennial allergic rhinitis  Continue appropriate aeroallergen avoidance measures and immunotherapy injections.  To avoid diminishing benefit with daily use (tachyphylaxis) of second generation antihistamine, consider alternating every few months between fexofenadine (Allegra) and levocetirizine (Xyzal).  Nasal saline spray (i.e., Simply Saline) or nasal saline lavage (i.e.,  NeilMed) is recommended as needed.   Diagnostics: Spirometry reveals an FVC of 2.74 L (86% predicted) and an FEV1 of 2.07 L (79% predicted) with an FEV1 ratio of 93%.  Please see scanned spirometry results for details.    Physical examination: Blood pressure 126/88, pulse 99, resp. rate 16, height 5' (1.524 m), weight 160 lb (72.6 kg), last menstrual period 05/05/2017, SpO2 96 %.  General: Alert, interactive, in no acute distress. HEENT: TMs pearly gray, turbinates mildly edematous without discharge, post-pharynx unremarkable. Neck: Supple without lymphadenopathy. Lungs: Clear to auscultation without wheezing, rhonchi or rales. CV: Normal S1, S2 without murmurs. Skin: Warm and dry, without lesions or rashes.  The following portions of the patient's history were reviewed and updated as appropriate: allergies, current medications, past family history, past medical history, past social history, past surgical history and problem list.  Allergies as of 05/18/2017      Reactions   Aspirin Shortness Of Breath, Other (See Comments)   Other reaction(s): Other (See Comments) Other reaction(s): Other (See Comments) Shortness of breath after consistently taking it for a few days Bronchospasm Shortness of breath after consistently taking it for a few days Other reaction(s): Other (See Comments) Shortness of breath after consistently taking it for a few days Bronchospasm Other reaction(s): Other (See Comments) Shortness of breath after consistently taking it for a few days Bronchospasm Shortness of breath after consistently taking it for a few days   Glucosamine Nausea And Vomiting, Shortness Of Breath   Other reaction(s): Cough   Ivp Dye [iodinated Diagnostic Agents] Shortness Of Breath, Nausea And Vomiting, Cough   Metrizamide Other (See Comments), Nausea And Vomiting, Shortness Of Breath   Other reaction(s): Other (See Comments) Other reaction(s): Cough Other reaction(s):  Other (See  Comments) unknown unknown Other reaction(s): Cough Other reaction(s): Other (See Comments) unknown Other reaction(s): Cough Other reaction(s): Other (See Comments) unknown unknown   Penicillins Shortness Of Breath, Cough, Other (See Comments)   Other reaction(s): Cough, Other (See Comments) Other reaction(s): Cough Has patient had a PCN reaction causing immediate rash, facial/tongue/throat swelling, SOB or lightheadedness with hypotension: yes Has patient had a PCN reaction causing severe rash involving mucus membranes or skin necrosis:no Has patient had a PCN reaction that required hospitalization: no Has patient had a PCN reaction occurring within the last 10 years: yes If all of the above answers are "NO", then may proceed with Cephalosporin use. Bronchospasm Has patient had a PCN reaction causing immediate rash, facial/tongue/throat swelling, SOB or lightheadedness with hypotension: yes Has patient had a PCN reaction causing severe rash involving mucus membranes or skin necrosis:no Has patient had a PCN reaction that required hospitalization: no Has patient had a PCN reaction occurring within the last 10 years: yes If all of the above answers are "NO", then may proceed with Cephalosporin use. Other reaction(s): Cough Has patient had a PCN reaction causing immediate rash, facial/tongue/throat swelling, SOB or lightheadedness with hypotension: yes Has patient had a PCN reaction causing severe rash involving mucus membranes or skin necrosis:no Has patient had a PCN reaction that required hospitalization: no Has patient had a PCN reaction occurring within the last 10 years: yes If all of the above answers are "NO", then may proceed with Cephalosporin use. Bronchospasm Other reaction(s): Cough Has patient had a PCN reaction causing immediate rash, facial/tongue/throat swelling, SOB or lightheadedness with hypotension: yes Has patient had a PCN reaction causing severe rash involving  mucus membranes or skin necrosis:no Has patient had a PCN reaction that required hospitalization: no Has patient had a PCN reaction occurring within the last 10 years: yes If all of the above answers are "NO", then may proceed with Cephalosporin use. Bronchospasm Has patient had a PCN reaction causing immediate rash, facial/tongue/throat swelling, SOB or lightheadedness with hypotension: yes Has patient had a PCN reaction causing severe rash involving mucus membranes or skin necrosis:no Has patient had a PCN reaction that required hospitalization: no Has patient had a PCN reaction occurring within the last 10 years: yes If all of the above answers are "NO", then may proceed with Cephalosporin use.   Qvar [beclomethasone]    Thrush, sore throat, unable to sleep Thrush, sore throat, unable to sleep Thrush, sore throat, unable to sleep Thrush, sore throat, unable to sleep Thrush, sore throat, unable to sleep Thrush, sore throat, unable to sleep Thrush, sore throat, unable to sleep Thrush, sore throat, unable to sleep   Shellfish Allergy Shortness Of Breath, Nausea And Vomiting, Cough   Shellfish-derived Products Nausea And Vomiting, Shortness Of Breath   Other reaction(s): Cough   Strawberry Extract Shortness Of Breath, Rash, Cough   Other reaction(s): Cough Other reaction(s): Cough chocking chocking Other reaction(s): Cough chocking Other reaction(s): Cough chocking chocking   Cephalexin Swelling   REACTION: swelling   Ibuprofen Other (See Comments)   Other reaction(s): Other (See Comments) Other reaction(s): Other (See Comments) REACTION: due to kidney disease REACTION: due to kidney disease Other reaction(s): Other (See Comments) REACTION: due to kidney disease Other reaction(s): Other (See Comments) REACTION: due to kidney disease REACTION: due to kidney disease   Latex Rash      Medication List        Accurate as of 05/18/17  4:56 PM. Always use your most  recent med  list.          acetaminophen 325 MG tablet Commonly known as:  TYLENOL Take 650 mg by mouth as needed.   AEROCHAMBER MV inhaler Use as instructed   albuterol 108 (90 Base) MCG/ACT inhaler Commonly known as:  PROVENTIL HFA;VENTOLIN HFA Inhale 1-2 puffs into the lungs every 6 (six) hours as needed for wheezing or shortness of breath.   Azelastine-Fluticasone 137-50 MCG/ACT Susp 1 spray by Each Nare route Two (2) times a day.   CYMBALTA 60 MG capsule Generic drug:  DULoxetine Take 60 mg by mouth daily.   diphenhydrAMINE 25 mg capsule Commonly known as:  BENADRYL Take 25 mg by mouth as needed.   EPINEPHrine 0.3 mg/0.3 mL Soaj injection Commonly known as:  EPI-PEN Inject into thigh for severe allergic reaction   EPINEPHrine 0.3 mg/0.3 mL Soaj injection Commonly known as:  AUVI-Q Use as directed for severe allergic reactions   hydrochlorothiazide 25 MG tablet Commonly known as:  HYDRODIURIL Take 25 mg by mouth.   LORazepam 0.5 MG tablet Commonly known as:  ATIVAN Take 1 mg by mouth.   Melatonin 3 MG Caps Take by mouth.   NATURE-THROID 97.5 MG Tabs Generic drug:  Thyroid Take 97.5 mg by mouth daily.   NONFORMULARY OR COMPOUNDED ITEM Inject 1 Dose into the skin once a week. Allergy Vaccine 1:10 Given at [place] Take 50 mg by mouth at bedtime. Progesterone 50mg  compounded capsule take for 3 weeks then stop while on cycle   sertraline 50 MG tablet Commonly known as:  ZOLOFT Take 50 mg by mouth daily.   spironolactone 50 MG tablet Commonly known as:  ALDACTONE Take 50 mg by mouth.   Tiotropium Bromide Monohydrate 1.25 MCG/ACT Aers Commonly known as:  SPIRIVA RESPIMAT Inhale 2 puffs into the lungs daily.   topiramate 25 MG tablet Commonly known as:  TOPAMAX TAKE 1/2 TABLET (12.5 MG TOTAL) BY MOUTH DAILY.   VYVANSE 30 MG capsule Generic drug:  lisdexamfetamine Take 30 mg by mouth daily.       Allergies  Allergen Reactions  .  Aspirin Shortness Of Breath and Other (See Comments)    Other reaction(s): Other (See Comments) Other reaction(s): Other (See Comments) Shortness of breath after consistently taking it for a few days Bronchospasm Shortness of breath after consistently taking it for a few days Other reaction(s): Other (See Comments) Shortness of breath after consistently taking it for a few days Bronchospasm Other reaction(s): Other (See Comments) Shortness of breath after consistently taking it for a few days Bronchospasm Shortness of breath after consistently taking it for a few days  . Glucosamine Nausea And Vomiting and Shortness Of Breath    Other reaction(s): Cough  . Ivp Dye [Iodinated Diagnostic Agents] Shortness Of Breath, Nausea And Vomiting and Cough  . Metrizamide Other (See Comments), Nausea And Vomiting and Shortness Of Breath    Other reaction(s): Other (See Comments) Other reaction(s): Cough Other reaction(s): Other (See Comments) unknown unknown Other reaction(s): Cough Other reaction(s): Other (See Comments) unknown Other reaction(s): Cough Other reaction(s): Other (See Comments) unknown unknown  . Penicillins Shortness Of Breath, Cough and Other (See Comments)    Other reaction(s): Cough, Other (See Comments) Other reaction(s): Cough Has patient had a PCN reaction causing immediate rash, facial/tongue/throat swelling, SOB or lightheadedness with hypotension: yes Has patient had a PCN reaction causing severe rash involving mucus membranes or skin necrosis:no Has patient had a PCN reaction that required hospitalization:  no Has patient had a PCN reaction occurring within the last 10 years: yes If all of the above answers are "NO", then may proceed with Cephalosporin use. Bronchospasm Has patient had a PCN reaction causing immediate rash, facial/tongue/throat swelling, SOB or lightheadedness with hypotension: yes Has patient had a PCN reaction causing severe rash involving mucus  membranes or skin necrosis:no Has patient had a PCN reaction that required hospitalization: no Has patient had a PCN reaction occurring within the last 10 years: yes If all of the above answers are "NO", then may proceed with Cephalosporin use. Other reaction(s): Cough Has patient had a PCN reaction causing immediate rash, facial/tongue/throat swelling, SOB or lightheadedness with hypotension: yes Has patient had a PCN reaction causing severe rash involving mucus membranes or skin necrosis:no Has patient had a PCN reaction that required hospitalization: no Has patient had a PCN reaction occurring within the last 10 years: yes If all of the above answers are "NO", then may proceed with Cephalosporin use. Bronchospasm Other reaction(s): Cough Has patient had a PCN reaction causing immediate rash, facial/tongue/throat swelling, SOB or lightheadedness with hypotension: yes Has patient had a PCN reaction causing severe rash involving mucus membranes or skin necrosis:no Has patient had a PCN reaction that required hospitalization: no Has patient had a PCN reaction occurring within the last 10 years: yes If all of the above answers are "NO", then may proceed with Cephalosporin use. Bronchospasm Has patient had a PCN reaction causing immediate rash, facial/tongue/throat swelling, SOB or lightheadedness with hypotension: yes Has patient had a PCN reaction causing severe rash involving mucus membranes or skin necrosis:no Has patient had a PCN reaction that required hospitalization: no Has patient had a PCN reaction occurring within the last 10 years: yes If all of the above answers are "NO", then may proceed with Cephalosporin use.   . Qvar [Beclomethasone]     Thrush, sore throat, unable to sleep Thrush, sore throat, unable to sleep Thrush, sore throat, unable to sleep Thrush, sore throat, unable to sleep Thrush, sore throat, unable to sleep Thrush, sore throat, unable to sleep Thrush, sore  throat, unable to sleep Thrush, sore throat, unable to sleep  . Shellfish Allergy Shortness Of Breath, Nausea And Vomiting and Cough  . Shellfish-Derived Products Nausea And Vomiting and Shortness Of Breath    Other reaction(s): Cough  . Strawberry Extract Shortness Of Breath, Rash and Cough    Other reaction(s): Cough Other reaction(s): Cough chocking chocking Other reaction(s): Cough chocking Other reaction(s): Cough chocking chocking  . Cephalexin Swelling    REACTION: swelling  . Ibuprofen Other (See Comments)    Other reaction(s): Other (See Comments) Other reaction(s): Other (See Comments) REACTION: due to kidney disease REACTION: due to kidney disease Other reaction(s): Other (See Comments) REACTION: due to kidney disease Other reaction(s): Other (See Comments) REACTION: due to kidney disease REACTION: due to kidney disease  . Latex Rash    I appreciate the opportunity to take part in [place] care. Please do not hesitate to contact me with questions.  Sincerely,   R. Edgar Frisk, MD

## 2017-05-18 NOTE — Patient Instructions (Signed)
Asthma Currently well controlled.  Continue albuterol HFA, 1 to 2 inhalations every 6 hours if needed.  During upper respiratory tract infections and asthma flares, add Spiriva, 1.25 g, 2 inhalations daily until symptoms have returned to baseline.    Subjective and objective measures of pulmonary function will be followed and the treatment plan will be adjusted accordingly.  Seasonal and perennial allergic rhinitis  Continue appropriate aeroallergen avoidance measures and immunotherapy injections.  To avoid diminishing benefit with daily use (tachyphylaxis) of second generation antihistamine, consider alternating every few months between fexofenadine (Allegra) and levocetirizine (Xyzal).  Nasal saline spray (i.e., Simply Saline) or nasal saline lavage (i.e., NeilMed) is recommended as needed.   Return in about 5 months (around 10/18/2017), or if symptoms worsen or fail to improve.

## 2017-05-18 NOTE — Assessment & Plan Note (Signed)
Currently well controlled.  Continue albuterol HFA, 1 to 2 inhalations every 6 hours if needed.  During upper respiratory tract infections and asthma flares, add Spiriva, 1.25 g, 2 inhalations daily until symptoms have returned to baseline.    Subjective and objective measures of pulmonary function will be followed and the treatment plan will be adjusted accordingly.

## 2017-05-21 NOTE — Addendum Note (Signed)
Addended by: Orpah Greek D on: 05/21/2017 03:33 PM   Modules accepted: Orders

## 2017-06-01 NOTE — Progress Notes (Signed)
VIALS EXP 06-05-18

## 2017-06-07 DIAGNOSIS — J3089 Other allergic rhinitis: Secondary | ICD-10-CM | POA: Diagnosis not present

## 2017-06-08 DIAGNOSIS — J301 Allergic rhinitis due to pollen: Secondary | ICD-10-CM | POA: Diagnosis not present

## 2017-06-08 IMAGING — CR DG CHEST 2V
2 series · 2 of 2 positions shown · non-contrast
Comparison: PA and lateral chest x-ray April 26, 2009.

CLINICAL DATA: Left-sided sharp chest pain since yesterday. One
week of shortness of breath. History of asthma -bronchitis,
gastroesophageal reflux, nonsmoker.

EXAM:
CHEST  2 VIEW

[w chest pa]
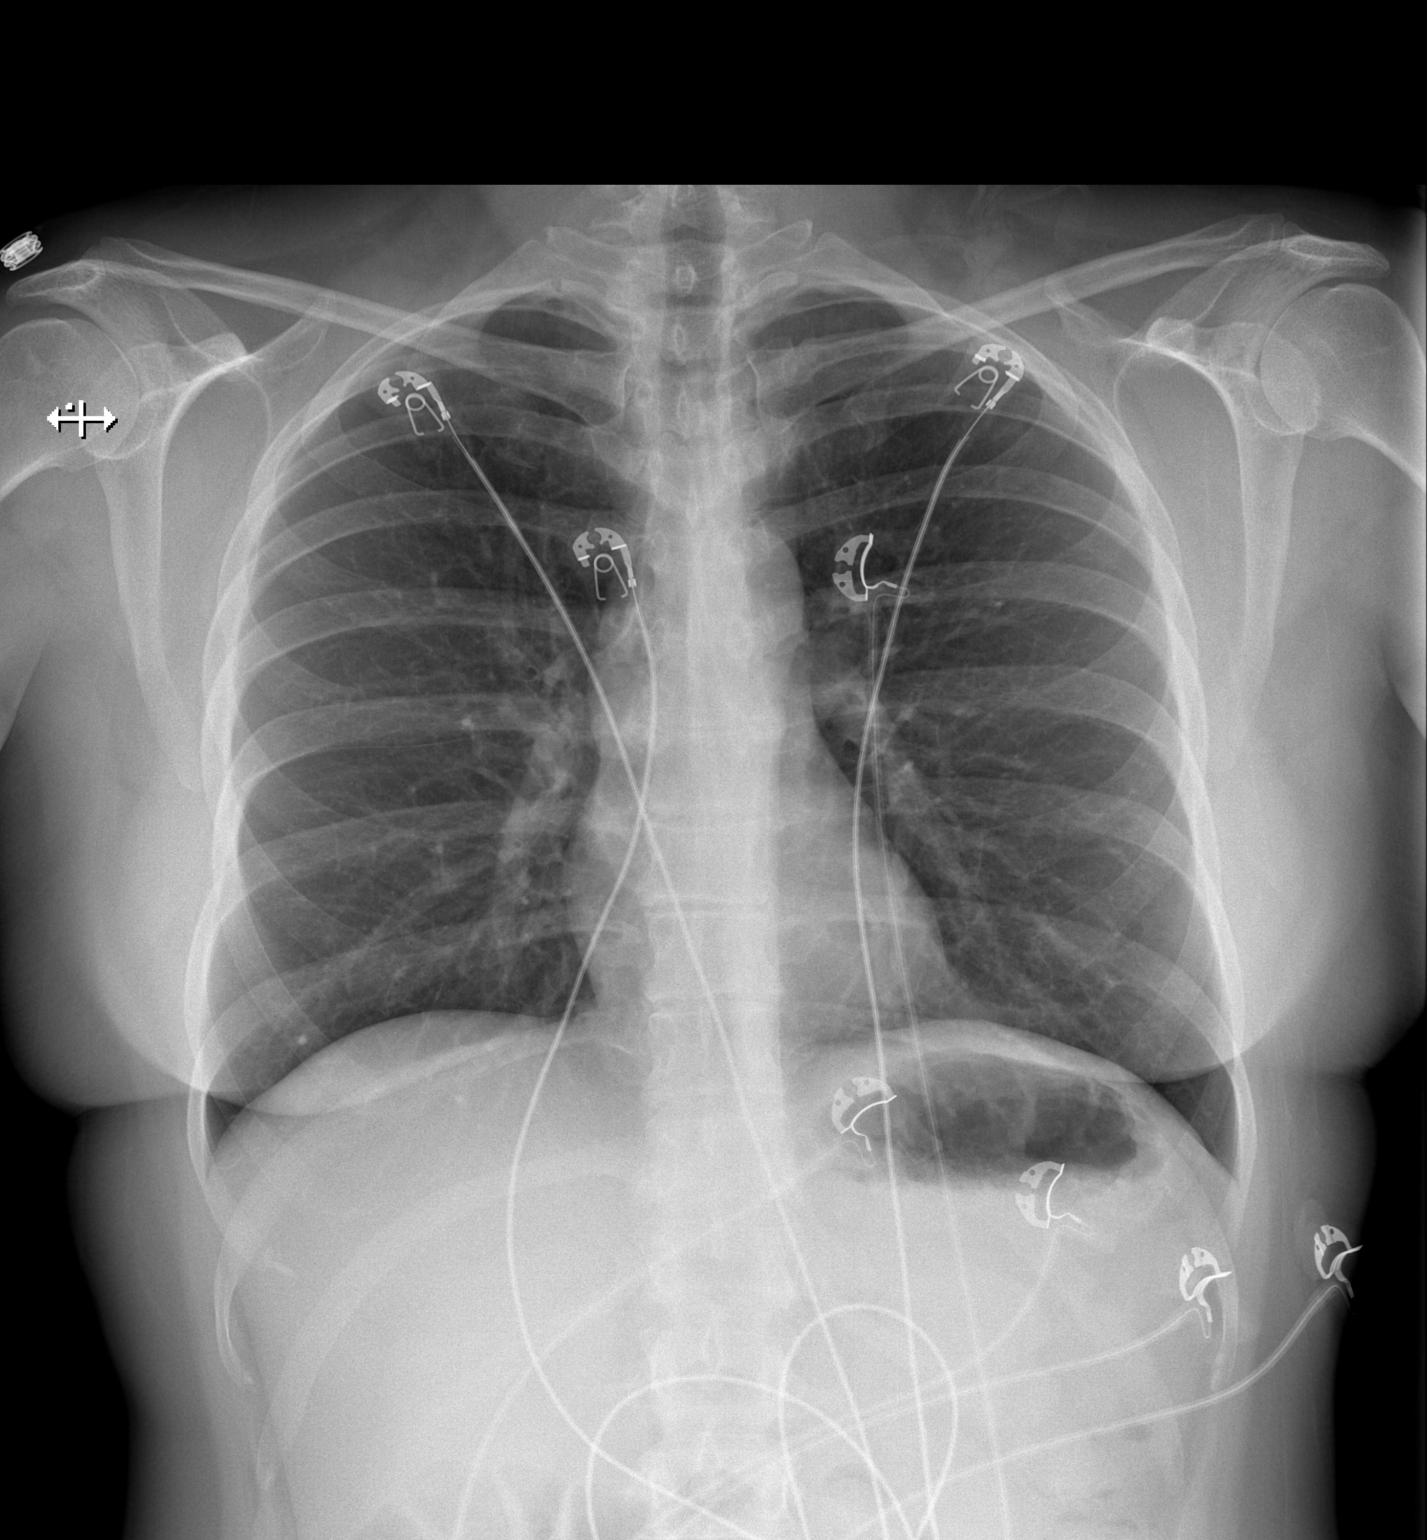

[w chest lat]
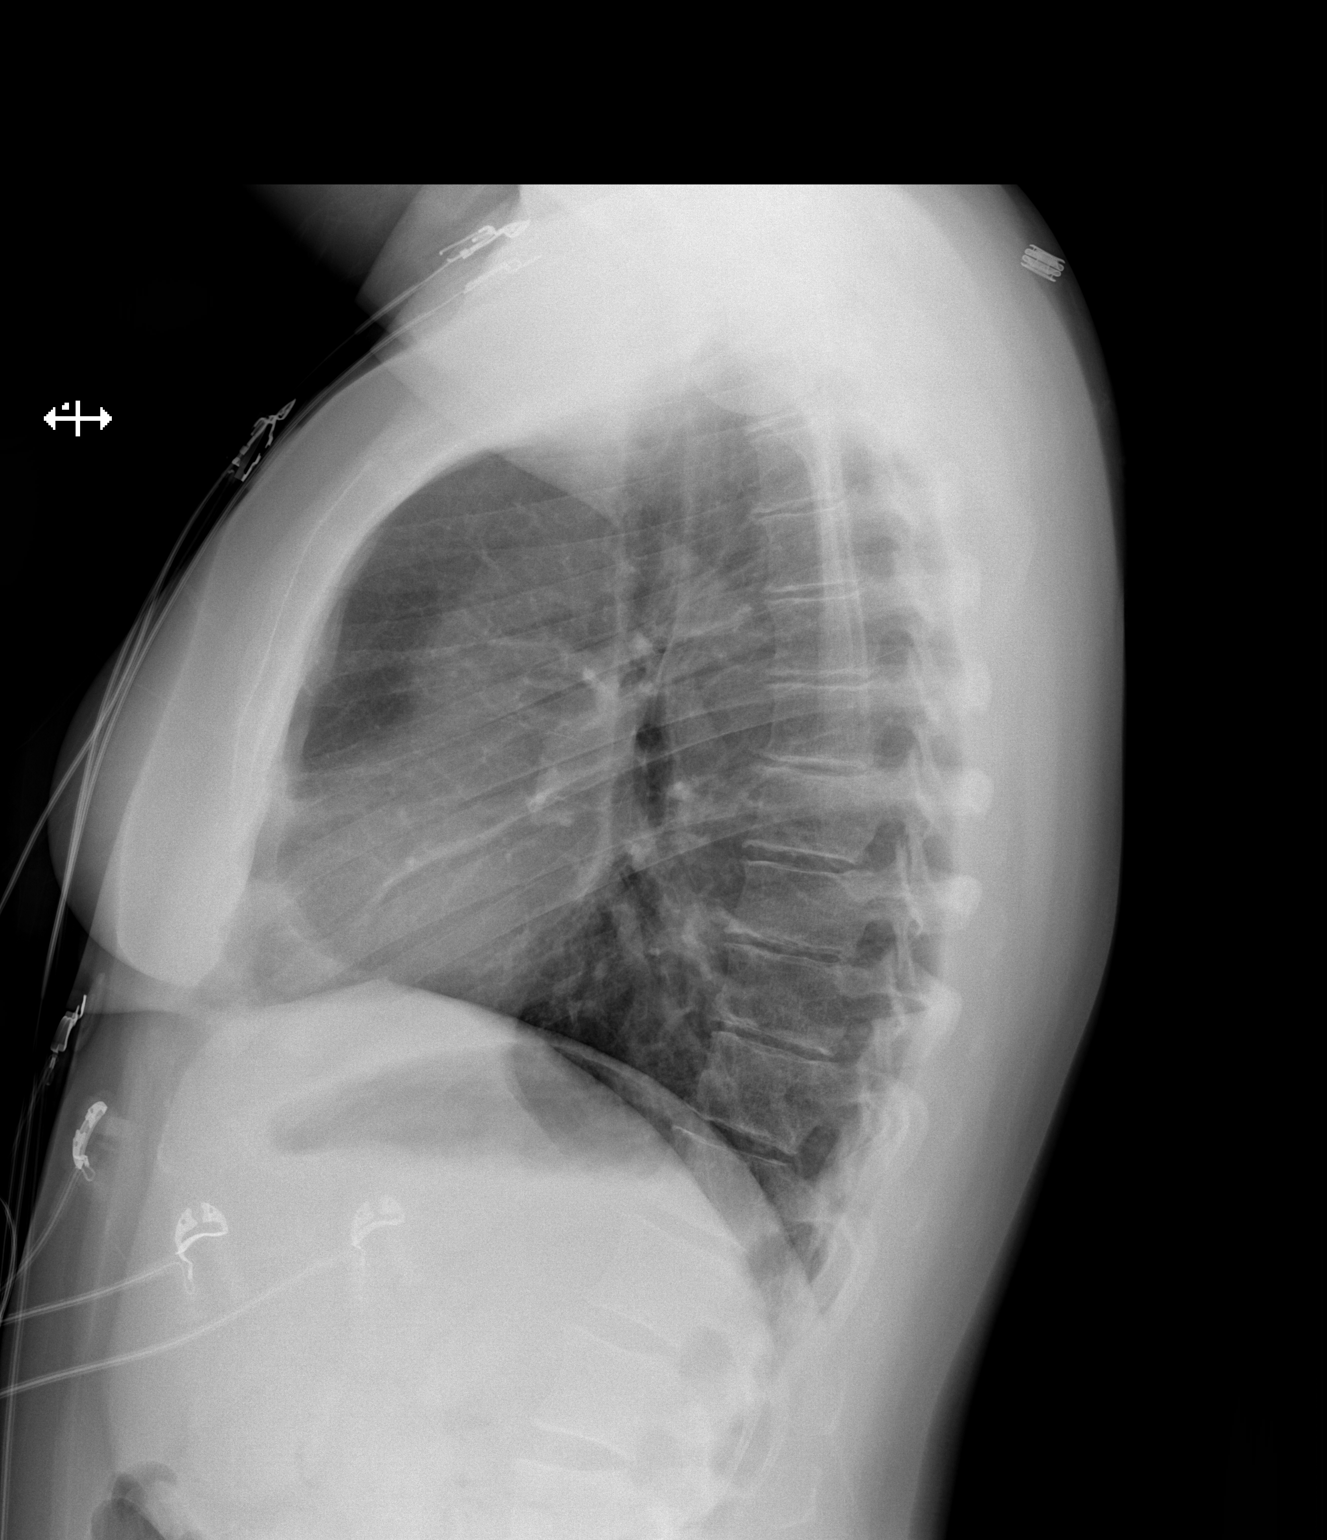

[2 of 2 positions shown; findings below may reference images not displayed]

FINDINGS: The lungs are adequately inflated. There is no focal infiltrate.
There is no pneumothorax, pneumomediastinum, or pleural effusion.
The heart and pulmonary vascularity are normal. The mediastinum is
normal in width. The bony thorax exhibits no acute abnormality.
IMPRESSION: There is no active cardiopulmonary disease.

## 2017-07-01 DIAGNOSIS — E039 Hypothyroidism, unspecified: Secondary | ICD-10-CM | POA: Diagnosis not present

## 2017-07-01 DIAGNOSIS — R5383 Other fatigue: Secondary | ICD-10-CM | POA: Diagnosis not present

## 2017-07-01 DIAGNOSIS — R5381 Other malaise: Secondary | ICD-10-CM | POA: Diagnosis not present

## 2017-07-15 DIAGNOSIS — R5381 Other malaise: Secondary | ICD-10-CM | POA: Diagnosis not present

## 2017-07-15 DIAGNOSIS — E039 Hypothyroidism, unspecified: Secondary | ICD-10-CM | POA: Diagnosis not present

## 2017-07-15 DIAGNOSIS — E7210 Disorders of sulfur-bearing amino-acid metabolism, unspecified: Secondary | ICD-10-CM | POA: Diagnosis not present

## 2017-08-05 DIAGNOSIS — E039 Hypothyroidism, unspecified: Secondary | ICD-10-CM | POA: Diagnosis not present

## 2017-08-05 DIAGNOSIS — R5383 Other fatigue: Secondary | ICD-10-CM | POA: Diagnosis not present

## 2017-08-26 ENCOUNTER — Encounter: Payer: Self-pay | Admitting: Internal Medicine

## 2017-10-07 DIAGNOSIS — E282 Polycystic ovarian syndrome: Secondary | ICD-10-CM | POA: Diagnosis not present

## 2017-10-07 DIAGNOSIS — I10 Essential (primary) hypertension: Secondary | ICD-10-CM | POA: Diagnosis not present

## 2017-10-07 DIAGNOSIS — Z0000 Encounter for general adult medical examination without abnormal findings: Secondary | ICD-10-CM | POA: Diagnosis not present

## 2017-10-15 ENCOUNTER — Encounter: Payer: Self-pay | Admitting: Internal Medicine

## 2017-11-18 ENCOUNTER — Ambulatory Visit (AMBULATORY_SURGERY_CENTER): Payer: Self-pay

## 2017-11-18 VITALS — Ht 60.0 in | Wt 158.6 lb

## 2017-11-18 DIAGNOSIS — Z86010 Personal history of colonic polyps: Secondary | ICD-10-CM

## 2017-11-18 MED ORDER — NA SULFATE-K SULFATE-MG SULF 17.5-3.13-1.6 GM/177ML PO SOLN: 1.0000 | Freq: Once | ORAL | 0 refills | Status: AC

## 2017-11-18 NOTE — Progress Notes (Signed)
No egg or soy allergy known to patient  No issues with past sedation with any surgeries  or procedures, no intubation problems  No diet pills per patient No home 02 use per patient  No blood thinners per patient  Pt has chronic  Constipation. Entered two day prep. Patient states hx of bleeding with laxative use. Will not take Ducolax  No A fib or A flutter  EMMI video sent to pt's e mail. Pt declined

## 2017-12-02 ENCOUNTER — Ambulatory Visit (AMBULATORY_SURGERY_CENTER): Payer: 59 | Admitting: Internal Medicine

## 2017-12-02 ENCOUNTER — Encounter: Payer: Self-pay | Admitting: Internal Medicine

## 2017-12-02 VITALS — BP 112/76 | HR 72 | Temp 97.8°F | Resp 11 | Ht 60.0 in | Wt 160.0 lb

## 2017-12-02 DIAGNOSIS — Z86010 Personal history of colonic polyps: Secondary | ICD-10-CM

## 2017-12-02 MED ORDER — SODIUM CHLORIDE 0.9 % IV SOLN: 500.0000 mL | Freq: Once | INTRAVENOUS | Status: DC

## 2017-12-02 NOTE — Op Note (Signed)
[facility] Patient Name: Diane Ortiz Procedure Date: 12/02/2017 10:27 AM MRN: [id number] Endoscopist: Jerene Bears , MD Age: 42 Referring MD:  Date of Birth: 01/07/1976 Gender: Female Account #: [id number] Procedure:                Colonoscopy Indications:              Surveillance: Personal history of adenomatous                            polyps on last colonoscopy 5 years ago Medicines:                Monitored Anesthesia Care Procedure:                Pre-Anesthesia Assessment:                           - Prior to the procedure, a History and Physical                            was performed, and patient medications and                            allergies were reviewed. The patient's tolerance of                            previous anesthesia was also reviewed. The risks                            and benefits of the procedure and the sedation                            options and risks were discussed with the patient.                            All questions were answered, and informed consent                            was obtained. Prior Anticoagulants: The patient has                            taken no previous anticoagulant or antiplatelet                            agents. ASA Grade Assessment: II - A patient with                            mild systemic disease. After reviewing the risks                            and benefits, the patient was deemed in                            satisfactory condition to undergo the procedure.  After obtaining informed consent, the colonoscope                            was passed under direct vision. Throughout the                            procedure, the patient's blood pressure, pulse, and                            oxygen saturations were monitored continuously. The                            Colonoscope was introduced through the anus and                            advanced to the cecum,  identified by appendiceal                            orifice and ileocecal valve. The colonoscopy was                            performed without difficulty. The patient tolerated                            the procedure well. The quality of the bowel                            preparation was good. The ileocecal valve,                            appendiceal orifice, and rectum were photographed. Scope In: 10:36:15 AM Scope Out: 10:52:35 AM Scope Withdrawal Time: 0 hours 12 minutes 25 seconds  Total Procedure Duration: 0 hours 16 minutes 20 seconds  Findings:                 The digital rectal exam was normal.                           The entire examined colon appeared normal on direct                            and retroflexion views. Complications:            No immediate complications. Estimated Blood Loss:     Estimated blood loss: none. Impression:               - The entire examined colon is normal on direct and                            retroflexion views.                           - No specimens collected. Recommendation:           - Patient has a contact number available for  emergencies. The signs and symptoms of potential                            delayed complications were discussed with the                            patient. Return to normal activities tomorrow.                            Written discharge instructions were provided to the                            patient.                           - Resume previous diet.                           - Continue present medications.                           - Repeat colonoscopy in 5 years for surveillance. Jerene Bears, MD 12/02/2017 10:55:12 AM This report has been signed electronically.

## 2017-12-02 NOTE — Progress Notes (Signed)
To PACU, VSS. Report to [email or web address] 

## 2017-12-02 NOTE — Patient Instructions (Signed)
YOU HAD AN ENDOSCOPIC PROCEDURE TODAY AT [facility]:   Refer to the procedure report that was given to you for any specific questions about what was found during the examination.  If the procedure report does not answer your questions, please call your gastroenterologist to clarify.  If you requested that your care partner not be given the details of your procedure findings, then the procedure report has been included in a sealed envelope for you to review at your convenience later.  YOU SHOULD EXPECT: Some feelings of bloating in the abdomen. Passage of more gas than usual.  Walking can help get rid of the air that was put into your GI tract during the procedure and reduce the bloating. If you had a lower endoscopy (such as a colonoscopy or flexible sigmoidoscopy) you may notice spotting of blood in your stool or on the toilet paper. If you underwent a bowel prep for your procedure, you may not have a normal bowel movement for a few days.  Please Note:  You might notice some irritation and congestion in your nose or some drainage.  This is from the oxygen used during your procedure.  There is no need for concern and it should clear up in a day or so.  SYMPTOMS TO REPORT IMMEDIATELY:   Following lower endoscopy (colonoscopy or flexible sigmoidoscopy):  Excessive amounts of blood in the stool  Significant tenderness or worsening of abdominal pains  Swelling of the abdomen that is new, acute  Fever of 100F or higher   For urgent or emergent issues, a gastroenterologist can be reached at any hour by calling [phone].   DIET:  We do recommend a small meal at first, but then you may proceed to your regular diet.  Drink plenty of fluids but you should avoid alcoholic beverages for 24 hours.  ACTIVITY:  You should plan to take it easy for the rest of today and you should NOT DRIVE or use heavy machinery until tomorrow (because of the sedation medicines used during the test).     FOLLOW UP: Our staff will call the number listed on your records the next business day following your procedure to check on you and address any questions or concerns that you may have regarding the information given to you following your procedure. If we do not reach you, we will leave a message.  However, if you are feeling well and you are not experiencing any problems, there is no need to return our call.  We will assume that you have returned to your regular daily activities without incident.  If any biopsies were taken you will be contacted by phone or by letter within the next 1-3 weeks.  Please call us at [phone] if you have not heard about the biopsies in 3 weeks.    SIGNATURES/CONFIDENTIALITY: You and/or your care partner have signed paperwork which will be entered into your electronic medical record.  These signatures attest to the fact that that the information above on your After Visit Summary has been reviewed and is understood.  Full responsibility of the confidentiality of this discharge information lies with you and/or your care-partner.  Recall colonoscopy 5 years-2024

## 2017-12-02 NOTE — Progress Notes (Signed)
Pt's states no medical or surgical changes since previsit or office visit. 

## 2017-12-03 ENCOUNTER — Telehealth: Payer: Self-pay | Admitting: *Deleted

## 2017-12-03 ENCOUNTER — Telehealth: Payer: Self-pay

## 2017-12-03 NOTE — Telephone Encounter (Signed)
First follow up call attempt.  Voicemail with phone number identifier.  Message left to call if any questions or concerns.

## 2017-12-03 NOTE — Telephone Encounter (Signed)
Attempted to reach pt. With follow-up call following endoscopic procedure 12/02/2017.  LM on pt. Voice mail to call if she has any questions or concerns.

## 2018-02-07 DIAGNOSIS — K529 Noninfective gastroenteritis and colitis, unspecified: Secondary | ICD-10-CM | POA: Diagnosis not present

## 2019-06-08 ENCOUNTER — Ambulatory Visit: Payer: BLUE CROSS/BLUE SHIELD | Admitting: Physician Assistant

## 2019-08-17 ENCOUNTER — Ambulatory Visit: Payer: Self-pay | Admitting: Physician Assistant

## 2019-10-13 ENCOUNTER — Ambulatory Visit (INDEPENDENT_AMBULATORY_CARE_PROVIDER_SITE_OTHER): Payer: BC Managed Care – PPO | Admitting: Physician Assistant

## 2019-10-13 ENCOUNTER — Other Ambulatory Visit: Payer: Self-pay

## 2019-10-13 ENCOUNTER — Encounter: Payer: Self-pay | Admitting: Physician Assistant

## 2019-10-13 DIAGNOSIS — D489 Neoplasm of uncertain behavior, unspecified: Secondary | ICD-10-CM

## 2019-10-13 DIAGNOSIS — D485 Neoplasm of uncertain behavior of skin: Secondary | ICD-10-CM

## 2019-10-13 DIAGNOSIS — Z1283 Encounter for screening for malignant neoplasm of skin: Secondary | ICD-10-CM

## 2019-10-13 DIAGNOSIS — L918 Other hypertrophic disorders of the skin: Secondary | ICD-10-CM

## 2019-10-13 NOTE — Patient Instructions (Signed)

## 2019-10-13 NOTE — Progress Notes (Signed)
   Follow-Up Visit   Subjective  Diane Ortiz is a 44 y.o. female who presents for the following: Skin Tag (1 at the right armpit), Cyst (2 bumps at the right armpit), and Nevus (check moles).   The following portions of the chart were reviewed this encounter and updated as appropriate: Tobacco  Allergies  Meds  Problems  Med Hx  Surg Hx  Fam Hx      Objective  Well appearing patient in no apparent distress; mood and affect are within normal limits.  A full examination was performed including scalp, head, eyes, ears, nose, lips, neck, chest, axillae, abdomen, back, buttocks, bilateral upper extremities, bilateral lower extremities, hands, feet, fingers, toes, fingernails, and toenails. All findings within normal limits unless otherwise noted below.  Objective  head to toe: No atypical nevi No signs of non-mole skin cancer. Full body skin check   Objective  Right Axilla: Fleshy, skin-colored sessile and pedunculated papules.    Objective  Right Axilla: 1.5 cm nodule      Assessment & Plan  Screening for malignant neoplasm of skin head to toe  Yearly skin examinatins  Skin tag Right Axilla  Snip  with scissors   Destruction of lesion - Right Axilla  Destruction method comment:  Scissors were used to snip tag at the base Informed consent: discussed and consent obtained   Timeout:  patient name, date of birth, surgical site, and procedure verified Anesthesia: the lesion was anesthetized in a standard fashion   Anesthetic:  1% lidocaine w/ epinephrine 1-100,000 local infiltration Final wound size (cm):  0.4 Hemostasis achieved with:  pressure Outcome: patient tolerated procedure well with no complications   Post-procedure details: wound care instructions given    Neoplasm of uncertain behavior Right Axilla  Skin / nail biopsy Type of biopsy: punch   Informed consent: discussed and consent obtained   Timeout: patient name, date of birth, surgical site, and  procedure verified   Procedure prep:  Patient was prepped and draped in usual sterile fashion (Non sterile) Prep type:  Chlorhexidine Anesthesia: the lesion was anesthetized in a standard fashion   Anesthetic:  1% lidocaine w/ epinephrine 1-100,000 local infiltration Punch size:  5 mm Suture size:  4-0 Suture type: nylon   Suture removal (days):  11 Hemostasis achieved with: suture   Outcome: patient tolerated procedure well    Specimen 1 - Surgical pathology Differential Diagnosis: cyst Check Margins: No    I, Toniesha Zellner, PA-C, have reviewed all documentation's for this visit.  The documentation on 10/13/19 for the exam, diagnosis, procedures and orders are all accurate and complete.

## 2019-10-24 ENCOUNTER — Ambulatory Visit (INDEPENDENT_AMBULATORY_CARE_PROVIDER_SITE_OTHER): Payer: BC Managed Care – PPO

## 2019-10-24 ENCOUNTER — Other Ambulatory Visit: Payer: Self-pay

## 2019-10-24 DIAGNOSIS — Z4802 Encounter for removal of sutures: Secondary | ICD-10-CM

## 2019-10-24 NOTE — Progress Notes (Signed)
Path to patient suture removal x 2 present no signs of infection

## 2019-11-02 ENCOUNTER — Ambulatory Visit: Payer: Self-pay | Admitting: Physician Assistant

## 2022-12-10 ENCOUNTER — Encounter: Payer: Self-pay | Admitting: Internal Medicine
